# Patient Record
Sex: Female | Born: 1970 | Race: White | Hispanic: No | Marital: Single | Smoking: Never smoker
Health system: Southern US, Community
[De-identification: ages and names within clinical notes are randomized; demographics above are authoritative.]

## PROBLEM LIST (undated history)

## (undated) DIAGNOSIS — J45909 Unspecified asthma, uncomplicated: Secondary | ICD-10-CM

## (undated) DIAGNOSIS — F32A Depression, unspecified: Secondary | ICD-10-CM

## (undated) DIAGNOSIS — F329 Major depressive disorder, single episode, unspecified: Secondary | ICD-10-CM

## (undated) DIAGNOSIS — K5900 Constipation, unspecified: Secondary | ICD-10-CM

## (undated) DIAGNOSIS — G4700 Insomnia, unspecified: Secondary | ICD-10-CM

## (undated) DIAGNOSIS — I10 Essential (primary) hypertension: Secondary | ICD-10-CM

## (undated) HISTORY — DX: Essential (primary) hypertension: I10

## (undated) HISTORY — DX: Constipation, unspecified: K59.00

## (undated) HISTORY — DX: Unspecified asthma, uncomplicated: J45.909

## (undated) HISTORY — DX: Major depressive disorder, single episode, unspecified: F32.9

## (undated) HISTORY — DX: Depression, unspecified: F32.A

## (undated) HISTORY — DX: Insomnia, unspecified: G47.00

---

## 2013-08-15 ENCOUNTER — Ambulatory Visit: Payer: Self-pay

## 2015-05-12 ENCOUNTER — Other Ambulatory Visit: Payer: Self-pay

## 2015-05-12 NOTE — Telephone Encounter (Signed)
Called and left patient a voicemail to return my call and schedule an appointment. 

## 2015-05-12 NOTE — Telephone Encounter (Signed)
I do not recommend she take birth control pills, looking at her last blood pressure (note was from October) Please suggest to the patient that she use some other form of contraception (condoms, for example) and make an appointment to see her primary provider for blood pressure and to discuss contraception issues

## 2015-05-13 NOTE — Telephone Encounter (Signed)
Called and left patient a voicemail to return my call and schedule an appointment.

## 2015-05-14 NOTE — Telephone Encounter (Signed)
Tried to call patient but there was no answer so I left a voicemail for her to return my call and schedule an appointment.

## 2015-06-03 DIAGNOSIS — F32A Depression, unspecified: Secondary | ICD-10-CM

## 2015-06-03 DIAGNOSIS — F419 Anxiety disorder, unspecified: Secondary | ICD-10-CM

## 2015-06-03 DIAGNOSIS — G4700 Insomnia, unspecified: Secondary | ICD-10-CM

## 2015-06-03 DIAGNOSIS — E669 Obesity, unspecified: Secondary | ICD-10-CM | POA: Insufficient documentation

## 2015-06-03 DIAGNOSIS — E039 Hypothyroidism, unspecified: Secondary | ICD-10-CM

## 2015-06-03 DIAGNOSIS — I10 Essential (primary) hypertension: Secondary | ICD-10-CM

## 2015-06-03 DIAGNOSIS — F411 Generalized anxiety disorder: Secondary | ICD-10-CM | POA: Insufficient documentation

## 2015-06-03 DIAGNOSIS — F329 Major depressive disorder, single episode, unspecified: Secondary | ICD-10-CM

## 2015-06-03 DIAGNOSIS — Z713 Dietary counseling and surveillance: Secondary | ICD-10-CM | POA: Insufficient documentation

## 2015-06-03 DIAGNOSIS — E063 Autoimmune thyroiditis: Secondary | ICD-10-CM | POA: Insufficient documentation

## 2015-06-03 DIAGNOSIS — J45909 Unspecified asthma, uncomplicated: Secondary | ICD-10-CM

## 2015-06-07 ENCOUNTER — Encounter: Payer: Self-pay | Admitting: Unknown Physician Specialty

## 2015-06-07 ENCOUNTER — Ambulatory Visit (INDEPENDENT_AMBULATORY_CARE_PROVIDER_SITE_OTHER): Payer: PRIVATE HEALTH INSURANCE | Admitting: Unknown Physician Specialty

## 2015-06-07 VITALS — BP 145/89 | HR 102 | Temp 98.4°F | Ht 64.5 in | Wt 176.8 lb

## 2015-06-07 DIAGNOSIS — G4700 Insomnia, unspecified: Secondary | ICD-10-CM

## 2015-06-07 DIAGNOSIS — I10 Essential (primary) hypertension: Secondary | ICD-10-CM

## 2015-06-07 DIAGNOSIS — F329 Major depressive disorder, single episode, unspecified: Secondary | ICD-10-CM

## 2015-06-07 DIAGNOSIS — G43909 Migraine, unspecified, not intractable, without status migrainosus: Secondary | ICD-10-CM

## 2015-06-07 DIAGNOSIS — F32A Depression, unspecified: Secondary | ICD-10-CM

## 2015-06-07 MED ORDER — DESOGESTREL-ETHINYL ESTRADIOL 0.15-30 MG-MCG PO TABS: 1.0000 | ORAL_TABLET | Freq: Every day | ORAL | Status: DC

## 2015-06-07 MED ORDER — SUMATRIPTAN 20 MG/ACT NA SOLN: 20.0000 mg | Freq: Two times a day (BID) | NASAL | Status: DC

## 2015-06-07 NOTE — Progress Notes (Signed)
BP 145/89 mmHg  Pulse 102  Temp(Src) 98.4 F (36.9 C)  Ht 5' 4.5" (1.638 m)  Wt 176 lb 12.8 oz (80.196 kg)  BMI 29.89 kg/m2  SpO2 98%  LMP  (LMP Unknown)   Subjective:    Patient ID: Laurie Martinez, female    DOB: 09/24/1971, 44 y.o.   MRN: [id number]  HPI: Laurie Martinez is a 44 y.o. female  Chief Complaint  Patient presents with  . Depression  . Hypertension  . Medication Refill    pt states she needs imitrex refilled and needs it to go to [facility] in [place] because normal pharmacy cannot give it to her   Depresssion: Pt states she is doing better than she has in a long time.  She is losing weight on a modified Paleo diet.  She is also trying to get rid of things.  PHQ 2 is 2.    Hypertension This is a chronic problem. The problem is controlled (High today but very nervous.  BP outside the office is 120-s to 130's over 80's). Associated symptoms include anxiety. Pertinent negatives include no chest pain, neck pain or shortness of breath. There are no known risk factors for coronary artery disease. The current treatment provides moderate improvement. There are no compliance problems.    Migraines Needs a refill of meds.  Gets migraines once or twice a month   Constipation Controlled with Miralax that she takes daily.     Relevant past medical, surgical, family and social history reviewed and updated as indicated. Interim medical history since our last visit reviewed. Allergies and medications reviewed and updated.  Review of Systems  Respiratory: Negative for shortness of breath.   Cardiovascular: Negative for chest pain.  Musculoskeletal: Negative for neck pain.    Per HPI unless specifically indicated above     Objective:    BP 145/89 mmHg  Pulse 102  Temp(Src) 98.4 F (36.9 C)  Ht 5' 4.5" (1.638 m)  Wt 176 lb 12.8 oz (80.196 kg)  BMI 29.89 kg/m2  SpO2 98%  LMP  (LMP Unknown)  Wt Readings from Last 3 Encounters:  06/07/15 176 lb 12.8 oz (80.196 kg)   06/03/15 181 lb (82.101 kg)    Physical Exam  Constitutional: She is oriented to person, place, and time. She appears well-developed and well-nourished. No distress.  HENT:  Head: Normocephalic and atraumatic.  Eyes: Conjunctivae and lids are normal. Right eye exhibits no discharge. Left eye exhibits no discharge. No scleral icterus.  Cardiovascular: Normal rate, regular rhythm and normal heart sounds.   Pulmonary/Chest: Effort normal and breath sounds normal. No respiratory distress.  Abdominal: Normal appearance and bowel sounds are normal. She exhibits no distension. There is no splenomegaly or hepatomegaly. There is no tenderness.  Musculoskeletal: Normal range of motion.  Neurological: She is alert and oriented to person, place, and time.  Skin: Skin is intact. No rash noted. No pallor.  Psychiatric: She has a normal mood and affect. Her behavior is normal. Judgment and thought content normal.  Vitals reviewed.   No results found for this or any previous visit.    Assessment & Plan:   Problem List Items Addressed This Visit      Unprioritized   Insomnia    Using 8-10 Ambien per year      Depression    Stable.  Continue present meds.  Takes 1-2 Clonazepam/month      Hypertension    Stable outside the office.  Continue  present meds      Migraines - Primary    Continue current meds.  Stable      Relevant Medications   SUMAtriptan (IMITREX) 20 MG/ACT nasal spray       Follow up plan: Return in about 3 months (around 09/07/2015) for physical.

## 2015-06-07 NOTE — Assessment & Plan Note (Signed)
Continue current meds. Stable.  

## 2015-06-07 NOTE — Assessment & Plan Note (Signed)
Using 8-10 Ambien per year

## 2015-06-07 NOTE — Assessment & Plan Note (Signed)
Stable outside the office.  Continue present meds

## 2015-06-07 NOTE — Assessment & Plan Note (Addendum)
Stable.  Continue present meds.  Takes 1-2 Clonazepam/month

## 2015-07-21 ENCOUNTER — Telehealth: Payer: Self-pay | Admitting: Unknown Physician Specialty

## 2015-07-21 MED ORDER — ALBUTEROL SULFATE HFA 108 (90 BASE) MCG/ACT IN AERS: 2.0000 | INHALATION_SPRAY | RESPIRATORY_TRACT | Status: DC | PRN

## 2015-07-21 NOTE — Telephone Encounter (Signed)
[facility] called stated pt needs an RX for an inhaler (Ventolin), and update all regular maintenance meds. Thanks.

## 2015-07-21 NOTE — Telephone Encounter (Signed)
Routing to provider  

## 2015-08-20 ENCOUNTER — Other Ambulatory Visit: Payer: Self-pay

## 2015-08-20 MED ORDER — MONTELUKAST SODIUM 10 MG PO TABS: 10.0000 mg | ORAL_TABLET | Freq: Every day | ORAL | Status: DC

## 2015-08-20 NOTE — Telephone Encounter (Signed)
PATIENT: Laurie Martinez DOB: 11-24-1970 PHARMACY: [facility] LAST VISIT: 06/07/2015  Patient requests Singulair 10mg  tab # 30.

## 2015-09-07 ENCOUNTER — Ambulatory Visit (INDEPENDENT_AMBULATORY_CARE_PROVIDER_SITE_OTHER): Payer: PRIVATE HEALTH INSURANCE | Admitting: Unknown Physician Specialty

## 2015-09-07 ENCOUNTER — Encounter: Payer: Self-pay | Admitting: Unknown Physician Specialty

## 2015-09-07 VITALS — BP 135/87 | HR 92 | Temp 98.7°F | Ht 63.5 in | Wt 174.2 lb

## 2015-09-07 DIAGNOSIS — E039 Hypothyroidism, unspecified: Secondary | ICD-10-CM

## 2015-09-07 DIAGNOSIS — I10 Essential (primary) hypertension: Secondary | ICD-10-CM | POA: Diagnosis not present

## 2015-09-07 DIAGNOSIS — Z23 Encounter for immunization: Secondary | ICD-10-CM | POA: Diagnosis not present

## 2015-09-07 DIAGNOSIS — J4531 Mild persistent asthma with (acute) exacerbation: Secondary | ICD-10-CM | POA: Diagnosis not present

## 2015-09-07 DIAGNOSIS — J40 Bronchitis, not specified as acute or chronic: Secondary | ICD-10-CM | POA: Diagnosis not present

## 2015-09-07 DIAGNOSIS — G4700 Insomnia, unspecified: Secondary | ICD-10-CM

## 2015-09-07 DIAGNOSIS — Z0000 Encounter for general adult medical examination without abnormal findings: Secondary | ICD-10-CM | POA: Diagnosis not present

## 2015-09-07 LAB — MICROALBUMIN, URINE WAIVED
CREATININE, URINE WAIVED: 200 mg/dL (ref 10–300)
MICROALB, UR WAIVED: 30 mg/L — AB (ref 0–19)

## 2015-09-07 MED ORDER — FLUTICASONE-SALMETEROL 250-50 MCG/DOSE IN AEPB: 1.0000 | INHALATION_SPRAY | Freq: Two times a day (BID) | RESPIRATORY_TRACT | Status: DC

## 2015-09-07 MED ORDER — LEVOTHYROXINE SODIUM 50 MCG PO TABS: 50.0000 ug | ORAL_TABLET | Freq: Every day | ORAL | Status: DC

## 2015-09-07 MED ORDER — AZITHROMYCIN 250 MG PO TABS: ORAL_TABLET | ORAL | Status: DC

## 2015-09-07 NOTE — Assessment & Plan Note (Addendum)
High today.  Stop OTC sinus medication.  On recheck SBP was 135

## 2015-09-07 NOTE — Progress Notes (Signed)
BP 135/87 mmHg  Pulse 92  Temp(Src) 98.7 F (37.1 C)  Ht 5' 3.5" (1.613 m)  Wt 174 lb 3.2 oz (79.017 kg)  BMI 30.37 kg/m2  SpO2 97%  LMP 06/15/2015 (Approximate)   Subjective:    Patient ID: Laurie Martinez, female    DOB: 05/06/1971, 44 y.o.   MRN: [id number]  HPI: Laurie Martinez is a 44 y.o. female  Chief Complaint  Patient presents with  . Annual Exam    pt states she wants to talk to provider about flu shot before getting it   Depression screen [facility] 2/9 09/07/2015  Decreased Interest 2  Down, Depressed, Hopeless 1  PHQ - 2 Score 3  Altered sleeping 3  Tired, decreased energy 1  Change in appetite 0  Feeling bad or failure about yourself  0  Trouble concentrating 0  Moving slowly or fidgety/restless 0  Suicidal thoughts 0  PHQ-9 Score 7   Cough She has been sick for about 6 weeks.  SOB with activity.  She does wheeze with activity.  She is taking OTC cold and sinus meds.  ? Reason for elevated blood sugar.  No fever, no nasal congestion after the first week or 2.    Hypothyroid Weight is stable.  Energy level is good when she is not sick   Insomnia/Anxiety Takes Clonazepam or Ambien a couple of times a month.  Still doesn't sleep well.    Relevant past medical, surgical, family and social history reviewed and updated as indicated. Interim medical history since our last visit reviewed. Allergies and medications reviewed and updated.  Review of Systems  Constitutional: Negative.   HENT: Negative.   Eyes: Negative.   Cardiovascular: Negative.   Gastrointestinal: Negative.   Endocrine: Negative.   Genitourinary: Negative.   Musculoskeletal: Negative.   Skin: Negative.   Allergic/Immunologic: Negative.   Neurological: Negative.   Hematological: Negative.   Psychiatric/Behavioral: Positive for sleep disturbance.    Per HPI unless specifically indicated above     Objective:    BP 135/87 mmHg  Pulse 92  Temp(Src) 98.7 F (37.1 C)  Ht 5' 3.5" (1.613 m)   Wt 174 lb 3.2 oz (79.017 kg)  BMI 30.37 kg/m2  SpO2 97%  LMP 06/15/2015 (Approximate)  Wt Readings from Last 3 Encounters:  09/07/15 174 lb 3.2 oz (79.017 kg)  06/07/15 176 lb 12.8 oz (80.196 kg)  06/03/15 181 lb (82.101 kg)    Physical Exam  Constitutional: She is oriented to person, place, and time. She appears well-developed and well-nourished.  HENT:  Head: Normocephalic and atraumatic.  Eyes: Pupils are equal, round, and reactive to light. Right eye exhibits no discharge. Left eye exhibits no discharge. No scleral icterus.  Neck: Normal range of motion. Neck supple. Carotid bruit is not present. No thyromegaly present.  Cardiovascular: Normal rate, regular rhythm and normal heart sounds.  Exam reveals no gallop and no friction rub.   No murmur heard. Pulmonary/Chest: Effort normal and breath sounds normal. No respiratory distress. She has no wheezes. She has no rales.  Abdominal: Soft. Bowel sounds are normal. There is no tenderness. There is no rebound.  Genitourinary: No breast swelling, tenderness or discharge.  Musculoskeletal: Normal range of motion.  Lymphadenopathy:    She has no cervical adenopathy.  Neurological: She is alert and oriented to person, place, and time.  Skin: Skin is warm, dry and intact. No rash noted.  Psychiatric: She has a normal mood and affect. Her speech is  normal and behavior is normal. Judgment and thought content normal. Cognition and memory are normal.    No results found for this or any previous visit.    Assessment & Plan:   Problem List Items Addressed This Visit      Unprioritized   Insomnia    Discussed CBT for sleep and exercise      Hypothyroidism    Check TSH      Relevant Medications   levothyroxine (SYNTHROID, LEVOTHROID) 50 MCG tablet   Other Relevant Orders   TSH   Hypertension - Primary    High today.  Stop OTC sinus medication.  On recheck SBP was 135      Relevant Orders   Comprehensive metabolic panel    Lipid Panel w/o Chol/HDL Ratio   Uric acid   Microalbumin, Urine Waived    Other Visit Diagnoses    Routine general medical examination at a health care facility        Relevant Orders    CBC with Differential/Platelet    Comprehensive metabolic panel    Lipid Panel w/o Chol/HDL Ratio    TSH    HIV antibody    Vit D  25 hydroxy (rtn osteoporosis monitoring)    Tdap vaccine greater than or equal to 7yo IM (Completed)    Bronchitis        Rx for Advair with asthma flare.  Rx for Z pack    Asthma with acute exacerbation, mild persistent        Add Advair for one month with recent viral flare    Relevant Medications    Fluticasone-Salmeterol (ADVAIR DISKUS) 250-50 MCG/DOSE AEPB        Follow up plan: Return if symptoms worsen or fail to improve.

## 2015-09-07 NOTE — Assessment & Plan Note (Signed)
Check TSH 

## 2015-09-07 NOTE — Assessment & Plan Note (Signed)
Discussed CBT for sleep and exercise

## 2015-09-07 NOTE — Patient Instructions (Signed)
Studies have shown that cognitive behavioral therapies for sleep are more effective than medications.  There are some less expensive on-line programs for this that are a self-paced 6 week program.  Go to shuti.com(used by sleep labs) or https://www.haley-woods.biz/cbtforsleep.com.  There are others that are probably just as effective.

## 2015-09-08 ENCOUNTER — Encounter: Payer: Self-pay | Admitting: Unknown Physician Specialty

## 2015-09-08 LAB — COMPREHENSIVE METABOLIC PANEL
ALK PHOS: 69 IU/L (ref 39–117)
ALT: 8 IU/L (ref 0–32)
AST: 13 IU/L (ref 0–40)
Albumin/Globulin Ratio: 1.3 (ref 1.1–2.5)
Albumin: 4 g/dL (ref 3.5–5.5)
BILIRUBIN TOTAL: 0.2 mg/dL (ref 0.0–1.2)
BUN/Creatinine Ratio: 14 (ref 9–23)
BUN: 11 mg/dL (ref 6–24)
CHLORIDE: 100 mmol/L (ref 97–106)
CO2: 23 mmol/L (ref 18–29)
Calcium: 9 mg/dL (ref 8.7–10.2)
Creatinine, Ser: 0.78 mg/dL (ref 0.57–1.00)
GFR calc Af Amer: 108 mL/min/{1.73_m2} (ref >59)
GFR calc non Af Amer: 93 mL/min/{1.73_m2} (ref >59)
GLUCOSE: 89 mg/dL (ref 65–99)
Globulin, Total: 3 g/dL (ref 1.5–4.5)
Potassium: 4.8 mmol/L (ref 3.5–5.2)
Sodium: 138 mmol/L (ref 136–144)
Total Protein: 7 g/dL (ref 6.0–8.5)

## 2015-09-08 LAB — CBC WITH DIFFERENTIAL/PLATELET
BASOS ABS: 0 10*3/uL (ref 0.0–0.2)
Basos: 0 %
EOS (ABSOLUTE): 0.1 10*3/uL (ref 0.0–0.4)
Eos: 1 %
Hematocrit: 40.5 % (ref 34.0–46.6)
Hemoglobin: 13.7 g/dL (ref 11.1–15.9)
Immature Grans (Abs): 0 10*3/uL (ref 0.0–0.1)
Immature Granulocytes: 0 %
LYMPHS ABS: 2.3 10*3/uL (ref 0.7–3.1)
LYMPHS: 27 %
MCH: 32.3 pg (ref 26.6–33.0)
MCHC: 33.8 g/dL (ref 31.5–35.7)
MCV: 96 fL (ref 79–97)
Monocytes Absolute: 0.4 10*3/uL (ref 0.1–0.9)
Monocytes: 5 %
NEUTROS ABS: 5.6 10*3/uL (ref 1.4–7.0)
Neutrophils: 67 %
PLATELETS: 254 10*3/uL (ref 150–379)
RBC: 4.24 x10E6/uL (ref 3.77–5.28)
RDW: 12.7 % (ref 12.3–15.4)
WBC: 8.4 10*3/uL (ref 3.4–10.8)

## 2015-09-08 LAB — TSH: TSH: 1.69 u[IU]/mL (ref 0.450–4.500)

## 2015-09-08 LAB — VITAMIN D 25 HYDROXY (VIT D DEFICIENCY, FRACTURES): VIT D 25 HYDROXY: 9.5 ng/mL — AB (ref 30.0–100.0)

## 2015-09-08 LAB — LIPID PANEL W/O CHOL/HDL RATIO
CHOLESTEROL TOTAL: 195 mg/dL (ref 100–199)
HDL: 61 mg/dL (ref >39)
LDL Calculated: 104 mg/dL — ABNORMAL HIGH (ref 0–99)
Triglycerides: 148 mg/dL (ref 0–149)
VLDL CHOLESTEROL CAL: 30 mg/dL (ref 5–40)

## 2015-09-08 LAB — HIV ANTIBODY (ROUTINE TESTING W REFLEX): HIV Screen 4th Generation wRfx: NONREACTIVE

## 2015-09-08 LAB — URIC ACID: Uric Acid: 5.1 mg/dL (ref 2.5–7.1)

## 2015-09-08 NOTE — Progress Notes (Signed)
Quick Note:  Normal labs except low Vitamin D. Patient notified by letter. ______

## 2015-10-28 ENCOUNTER — Other Ambulatory Visit: Payer: Self-pay

## 2015-10-28 MED ORDER — FLUTICASONE-SALMETEROL 250-50 MCG/DOSE IN AEPB: 1.0000 | INHALATION_SPRAY | Freq: Two times a day (BID) | RESPIRATORY_TRACT | Status: DC

## 2015-10-28 NOTE — Telephone Encounter (Signed)
Patient was last seen 09/07/15 and pharmacy is [facility].

## 2015-11-12 ENCOUNTER — Other Ambulatory Visit: Payer: Self-pay

## 2015-11-12 MED ORDER — DESOGESTREL-ETHINYL ESTRADIOL 0.15-30 MG-MCG PO TABS: 1.0000 | ORAL_TABLET | Freq: Every day | ORAL | Status: DC

## 2015-11-12 NOTE — Telephone Encounter (Signed)
Patient was last seen 09/07/15 and pharmacy is [facility]. 

## 2016-01-04 ENCOUNTER — Other Ambulatory Visit: Payer: Self-pay

## 2016-01-04 MED ORDER — CLONAZEPAM 0.5 MG PO TABS: 0.5000 mg | ORAL_TABLET | Freq: Every day | ORAL | Status: DC | PRN

## 2016-01-04 MED ORDER — ZOLPIDEM TARTRATE 10 MG PO TABS: 10.0000 mg | ORAL_TABLET | Freq: Every evening | ORAL | Status: DC | PRN

## 2016-01-04 NOTE — Telephone Encounter (Signed)
Called and left patient a voicemail letting her know that her prescriptions were ready for her to pick up and take to her pharmacy to be filled.

## 2016-01-04 NOTE — Telephone Encounter (Signed)
Patient was last seen 09/07/15 for physical.

## 2016-05-08 ENCOUNTER — Other Ambulatory Visit: Payer: Self-pay | Admitting: Unknown Physician Specialty

## 2016-05-29 ENCOUNTER — Other Ambulatory Visit: Payer: Self-pay | Admitting: Unknown Physician Specialty

## 2016-05-29 NOTE — Telephone Encounter (Signed)
Your patient 

## 2016-09-15 ENCOUNTER — Ambulatory Visit (INDEPENDENT_AMBULATORY_CARE_PROVIDER_SITE_OTHER): Payer: Managed Care, Other (non HMO) | Admitting: Unknown Physician Specialty

## 2016-09-15 ENCOUNTER — Encounter: Payer: Self-pay | Admitting: Unknown Physician Specialty

## 2016-09-15 VITALS — BP 145/88 | HR 98 | Temp 97.8°F | Ht 64.5 in | Wt 186.0 lb

## 2016-09-15 DIAGNOSIS — J45909 Unspecified asthma, uncomplicated: Secondary | ICD-10-CM

## 2016-09-15 DIAGNOSIS — G43909 Migraine, unspecified, not intractable, without status migrainosus: Secondary | ICD-10-CM

## 2016-09-15 DIAGNOSIS — E039 Hypothyroidism, unspecified: Secondary | ICD-10-CM

## 2016-09-15 DIAGNOSIS — F329 Major depressive disorder, single episode, unspecified: Secondary | ICD-10-CM | POA: Diagnosis not present

## 2016-09-15 DIAGNOSIS — F5101 Primary insomnia: Secondary | ICD-10-CM

## 2016-09-15 DIAGNOSIS — F32A Depression, unspecified: Secondary | ICD-10-CM

## 2016-09-15 DIAGNOSIS — I10 Essential (primary) hypertension: Secondary | ICD-10-CM

## 2016-09-15 MED ORDER — DESOGESTREL-ETHINYL ESTRADIOL 0.15-30 MG-MCG PO TABS: 1.0000 | ORAL_TABLET | Freq: Every day | ORAL | 0 refills | Status: DC

## 2016-09-15 NOTE — Assessment & Plan Note (Addendum)
Uses Albuterol occasionally.  Using Advair during the difficult periods of the year.  Continue present

## 2016-09-15 NOTE — Progress Notes (Signed)
BP (!) 145/88 (BP Location: Left Arm, Cuff Size: Large)   Pulse 98   Temp 97.8 F (36.6 C)   Ht 5' 4.5" (1.638 m)   Wt 186 lb (84.4 kg)   LMP 09/12/2016 (Approximate)   SpO2 97%   BMI 31.43 kg/m    Subjective:    Patient ID: Laurie Martinez, female    DOB: 1971-02-18, 45 y.o.   MRN: [id number]  HPI: Laurie Martinez is a 45 y.o. female   Family History  Problem Relation Age of Onset  . Depression Mother   . Migraines Sister    Social History   Social History  . Marital status: Single    Spouse name: N/A  . Number of children: N/A  . Years of education: N/A   Occupational History  . Not on file.   Social History Main Topics  . Smoking status: Never Smoker  . Smokeless tobacco: Never Used  . Alcohol use No  . Drug use: No  . Sexual activity: No   Other Topics Concern  . Not on file   Social History Narrative  . No narrative on file   Past Medical History:  Diagnosis Date  . Asthma   . Constipation   . Depression   . Hypertension   . Insomnia    Social History   Social History  . Marital status: Single    Spouse name: N/A  . Number of children: N/A  . Years of education: N/A   Social History Main Topics  . Smoking status: Never Smoker  . Smokeless tobacco: Never Used  . Alcohol use No  . Drug use: No  . Sexual activity: No   Other Topics Concern  . None   Social History Narrative  . None   Hypertension Using medications without difficulty Average home BPs "a little higher"   No problems or lightheadedness No chest pain with exertion or shortness of breath No Edema  Hypothyroid Some weight gain since last visit  Depression I doing well and having more anxiety.  Takes occasional Clonazepam Depression screen [facility] 2/9 09/15/2016 09/07/2015  Decreased Interest 0 2  Down, Depressed, Hopeless 0 1  PHQ - 2 Score 0 3  Altered sleeping 3 3  Tired, decreased energy 1 1  Change in appetite 1 0  Feeling bad or failure about yourself  0 0    Trouble concentrating 1 0  Moving slowly or fidgety/restless 0 0  Suicidal thoughts 0 0  PHQ-9 Score 6 7    Insomnia Takes Ambien once a month  ASTHMA  Uses Albuterol occasionally.  Using Advair during the difficult periods of the year. Triggers: none Symptoms are well controlled:yes Using medications without problems: Night time symptoms:none Wheeze/SOB:none ER visits since last visit: none Missed work or school:none    Relevant past medical, surgical, family and social history reviewed and updated as indicated. Interim medical history since our last visit reviewed. Allergies and medications reviewed and updated.  Review of Systems  Per HPI unless specifically indicated above     Objective:    BP (!) 145/88 (BP Location: Left Arm, Cuff Size: Large)   Pulse 98   Temp 97.8 F (36.6 C)   Ht 5' 4.5" (1.638 m)   Wt 186 lb (84.4 kg)   LMP 09/12/2016 (Approximate)   SpO2 97%   BMI 31.43 kg/m   Wt Readings from Last 3 Encounters:  09/15/16 186 lb (84.4 kg)  09/07/15 174 lb 3.2 oz (79  kg)  06/07/15 176 lb 12.8 oz (80.2 kg)    Physical Exam  Constitutional: She is oriented to person, place, and time. She appears well-developed and well-nourished. No distress.  HENT:  Head: Normocephalic and atraumatic.  Eyes: Conjunctivae and lids are normal. Right eye exhibits no discharge. Left eye exhibits no discharge. No scleral icterus.  Neck: Normal range of motion. Neck supple. No JVD present. Carotid bruit is not present.  Cardiovascular: Normal rate, regular rhythm and normal heart sounds.   Pulmonary/Chest: Effort normal and breath sounds normal.  Abdominal: Normal appearance. There is no splenomegaly or hepatomegaly.  Musculoskeletal: Normal range of motion.  Neurological: She is alert and oriented to person, place, and time.  Skin: Skin is warm, dry and intact. No rash noted. No pallor.  Psychiatric: She has a normal mood and affect. Her behavior is normal. Judgment and  thought content normal.    Results for orders placed or performed in visit on 09/07/15  CBC with Differential/Platelet  Result Value Ref Range   WBC 8.4 3.4 - 10.8 x10E3/uL   RBC 4.24 3.77 - 5.28 x10E6/uL   Hemoglobin 13.7 11.1 - 15.9 g/dL   Hematocrit 16.140.5 09.634.0 - 46.6 %   MCV 96 79 - 97 fL   MCH 32.3 26.6 - 33.0 pg   MCHC 33.8 31.5 - 35.7 g/dL   RDW 04.512.7 40.912.3 - 81.115.4 %   Platelets 254 150 - 379 x10E3/uL   Neutrophils 67 %   Lymphs 27 %   Monocytes 5 %   Eos 1 %   Basos 0 %   Neutrophils Absolute 5.6 1.4 - 7.0 x10E3/uL   Lymphocytes Absolute 2.3 0.7 - 3.1 x10E3/uL   Monocytes Absolute 0.4 0.1 - 0.9 x10E3/uL   EOS (ABSOLUTE) 0.1 0.0 - 0.4 x10E3/uL   Basophils Absolute 0.0 0.0 - 0.2 x10E3/uL   Immature Granulocytes 0 %   Immature Grans (Abs) 0.0 0.0 - 0.1 x10E3/uL  Comprehensive metabolic panel  Result Value Ref Range   Glucose 89 65 - 99 mg/dL   BUN 11 6 - 24 mg/dL   Creatinine, Ser 9.140.78 0.57 - 1.00 mg/dL   GFR calc non Af Amer 93 >59 mL/min/1.73   GFR calc Af Amer 108 >59 mL/min/1.73   BUN/Creatinine Ratio 14 9 - 23   Sodium 138 136 - 144 mmol/L   Potassium 4.8 3.5 - 5.2 mmol/L   Chloride 100 97 - 106 mmol/L   CO2 23 18 - 29 mmol/L   Calcium 9.0 8.7 - 10.2 mg/dL   Total Protein 7.0 6.0 - 8.5 g/dL   Albumin 4.0 3.5 - 5.5 g/dL   Globulin, Total 3.0 1.5 - 4.5 g/dL   Albumin/Globulin Ratio 1.3 1.1 - 2.5   Bilirubin Total 0.2 0.0 - 1.2 mg/dL   Alkaline Phosphatase 69 39 - 117 IU/L   AST 13 0 - 40 IU/L   ALT 8 0 - 32 IU/L  Lipid Panel w/o Chol/HDL Ratio  Result Value Ref Range   Cholesterol, Total 195 100 - 199 mg/dL   Triglycerides 782148 0 - 149 mg/dL   HDL 61 >95>39 mg/dL   VLDL Cholesterol Cal 30 5 - 40 mg/dL   LDL Calculated 621104 (H) 0 - 99 mg/dL  TSH  Result Value Ref Range   TSH 1.690 0.450 - 4.500 uIU/mL  HIV antibody  Result Value Ref Range   HIV Screen 4th Generation wRfx Non Reactive Non Reactive  Vit D  25 hydroxy (rtn osteoporosis monitoring)  Result Value  Ref Range   Vit D, 25-Hydroxy 9.5 (L) 30.0 - 100.0 ng/mL  Uric acid  Result Value Ref Range   Uric Acid 5.1 2.5 - 7.1 mg/dL  Microalbumin, Urine Waived  Result Value Ref Range   Microalb, Ur Waived 30 (H) 0 - 19 mg/L   Creatinine, Urine Waived 200 10 - 300 mg/dL   Microalb/Creat Ratio <30 <30 mg/g      Assessment & Plan:   Problem List Items Addressed This Visit      Unprioritized   Asthma    Uses Albuterol occasionally.  Using Advair during the difficult periods of the year.  Continue present      Depression    Stable, continue present medications.        Relevant Orders   CBC with Differential/Platelet   Hypertension    A little high today      Relevant Orders   Comprehensive metabolic panel   Lipid Panel w/o Chol/HDL Ratio   TSH   Hypothyroidism - Primary   Relevant Orders   TSH   Insomnia    Occasional Ambien use      Migraines    With menses.  Stable          Follow up plan: Return for physical.

## 2016-09-15 NOTE — Patient Instructions (Addendum)
For anxiety Valerian root  L theanine  Chamomile - Not in tea  [place]hatanga  Magnesium glycinate or Citrate  Calcium

## 2016-09-15 NOTE — Assessment & Plan Note (Signed)
With menses.  Stable

## 2016-09-15 NOTE — Assessment & Plan Note (Signed)
A little high today

## 2016-09-15 NOTE — Assessment & Plan Note (Signed)
Stable, continue present medications.   

## 2016-09-15 NOTE — Assessment & Plan Note (Signed)
Occasional Ambien use

## 2016-09-16 LAB — LIPID PANEL W/O CHOL/HDL RATIO
Cholesterol, Total: 232 mg/dL — ABNORMAL HIGH (ref 100–199)
HDL: 59 mg/dL (ref >39)
LDL Calculated: 155 mg/dL — ABNORMAL HIGH (ref 0–99)
Triglycerides: 90 mg/dL (ref 0–149)
VLDL Cholesterol Cal: 18 mg/dL (ref 5–40)

## 2016-09-16 LAB — COMPREHENSIVE METABOLIC PANEL
ALBUMIN: 4 g/dL (ref 3.5–5.5)
ALT: 14 IU/L (ref 0–32)
AST: 17 IU/L (ref 0–40)
Albumin/Globulin Ratio: 1.3 (ref 1.2–2.2)
Alkaline Phosphatase: 76 IU/L (ref 39–117)
BUN / CREAT RATIO: 10 (ref 9–23)
BUN: 9 mg/dL (ref 6–24)
CO2: 20 mmol/L (ref 18–29)
CREATININE: 0.89 mg/dL (ref 0.57–1.00)
Calcium: 9.5 mg/dL (ref 8.7–10.2)
Chloride: 101 mmol/L (ref 96–106)
GFR, EST AFRICAN AMERICAN: 91 mL/min/{1.73_m2} (ref >59)
GFR, EST NON AFRICAN AMERICAN: 79 mL/min/{1.73_m2} (ref >59)
GLUCOSE: 103 mg/dL — AB (ref 65–99)
Globulin, Total: 3.2 g/dL (ref 1.5–4.5)
Potassium: 4.6 mmol/L (ref 3.5–5.2)
Sodium: 140 mmol/L (ref 134–144)
TOTAL PROTEIN: 7.2 g/dL (ref 6.0–8.5)

## 2016-09-16 LAB — CBC WITH DIFFERENTIAL/PLATELET
BASOS ABS: 0 10*3/uL (ref 0.0–0.2)
Basos: 1 %
EOS (ABSOLUTE): 0.2 10*3/uL (ref 0.0–0.4)
EOS: 3 %
HEMATOCRIT: 40.6 % (ref 34.0–46.6)
HEMOGLOBIN: 14 g/dL (ref 11.1–15.9)
IMMATURE GRANULOCYTES: 0 %
Immature Grans (Abs): 0 10*3/uL (ref 0.0–0.1)
LYMPHS: 35 %
Lymphocytes Absolute: 2.2 10*3/uL (ref 0.7–3.1)
MCH: 32.1 pg (ref 26.6–33.0)
MCHC: 34.5 g/dL (ref 31.5–35.7)
MCV: 93 fL (ref 79–97)
MONOCYTES: 7 %
Monocytes Absolute: 0.5 10*3/uL (ref 0.1–0.9)
NEUTROS PCT: 54 %
Neutrophils Absolute: 3.5 10*3/uL (ref 1.4–7.0)
Platelets: 274 10*3/uL (ref 150–379)
RBC: 4.36 x10E6/uL (ref 3.77–5.28)
RDW: 12.3 % (ref 12.3–15.4)
WBC: 6.5 10*3/uL (ref 3.4–10.8)

## 2016-09-16 LAB — TSH: TSH: 0.807 u[IU]/mL (ref 0.450–4.500)

## 2016-09-18 ENCOUNTER — Encounter: Payer: Self-pay | Admitting: Unknown Physician Specialty

## 2016-09-25 ENCOUNTER — Other Ambulatory Visit: Payer: Self-pay | Admitting: Unknown Physician Specialty

## 2016-12-13 ENCOUNTER — Ambulatory Visit (INDEPENDENT_AMBULATORY_CARE_PROVIDER_SITE_OTHER): Payer: Managed Care, Other (non HMO) | Admitting: Unknown Physician Specialty

## 2016-12-13 ENCOUNTER — Encounter: Payer: Self-pay | Admitting: Unknown Physician Specialty

## 2016-12-13 VITALS — BP 145/89 | HR 101 | Temp 98.8°F | Ht 63.3 in | Wt 180.8 lb

## 2016-12-13 DIAGNOSIS — E039 Hypothyroidism, unspecified: Secondary | ICD-10-CM

## 2016-12-13 DIAGNOSIS — F5101 Primary insomnia: Secondary | ICD-10-CM

## 2016-12-13 DIAGNOSIS — F329 Major depressive disorder, single episode, unspecified: Secondary | ICD-10-CM | POA: Diagnosis not present

## 2016-12-13 DIAGNOSIS — I10 Essential (primary) hypertension: Secondary | ICD-10-CM

## 2016-12-13 DIAGNOSIS — Z0000 Encounter for general adult medical examination without abnormal findings: Secondary | ICD-10-CM | POA: Diagnosis not present

## 2016-12-13 DIAGNOSIS — F32A Depression, unspecified: Secondary | ICD-10-CM

## 2016-12-13 LAB — MICROALBUMIN, URINE WAIVED
Creatinine, Urine Waived: 50 mg/dL (ref 10–300)
MICROALB, UR WAIVED: 10 mg/L (ref 0–19)

## 2016-12-13 NOTE — Assessment & Plan Note (Signed)
Stable

## 2016-12-13 NOTE — Patient Instructions (Addendum)
https://www.west-esparza.com/ sleepio  DASH Eating Plan DASH stands for "Dietary Approaches to Stop Hypertension." The DASH eating plan is a healthy eating plan that has been shown to reduce high blood pressure (hypertension). Additional health benefits may include reducing the risk of type 2 diabetes mellitus, heart disease, and stroke. The DASH eating plan may also help with weight loss. What do I need to know about the DASH eating plan? For the DASH eating plan, you will follow these general guidelines:  Choose foods with less than 150 milligrams of sodium per serving (as listed on the food label).  Use salt-free seasonings or herbs instead of table salt or sea salt.  Check with your health care provider or pharmacist before using salt substitutes.  Eat lower-sodium products. These are often labeled as "low-sodium" or "no salt added."  Eat fresh foods. Avoid eating a lot of canned foods.  Eat more vegetables, fruits, and low-fat dairy products.  Choose whole grains. Look for the word "whole" as the first word in the ingredient list.  Choose fish and skinless chicken or [place] more often than red meat. Limit fish, poultry, and meat to 6 oz (170 g) each day.  Limit sweets, desserts, sugars, and sugary drinks.  Choose heart-healthy fats.  Eat more home-cooked food and less restaurant, buffet, and fast food.  Limit fried foods.  Do not fry foods. Cook foods using methods such as baking, boiling, grilling, and broiling instead.  When eating at a restaurant, ask that your food be prepared with less salt, or no salt if possible. What foods can I eat? Seek help from a dietitian for individual calorie needs. Grains  Whole grain or whole wheat bread. Brown rice. Whole grain or whole wheat pasta. Quinoa, bulgur, and whole grain cereals. Low-sodium cereals. Corn or whole wheat flour tortillas. Whole grain cornbread. Whole grain crackers. Low-sodium crackers. Vegetables  Fresh or frozen vegetables (raw,  steamed, roasted, or grilled). Low-sodium or reduced-sodium tomato and vegetable juices. Low-sodium or reduced-sodium tomato sauce and paste. Low-sodium or reduced-sodium canned vegetables. Fruits  All fresh, canned (in natural juice), or frozen fruits. Meat and Other Protein Products  Ground beef (85% or leaner), grass-fed beef, or beef trimmed of fat. Skinless chicken or [place]. Ground chicken or [place]. Pork trimmed of fat. All fish and seafood. Eggs. Dried beans, peas, or lentils. Unsalted nuts and seeds. Unsalted canned beans. Dairy  Low-fat dairy products, such as skim or 1% milk, 2% or reduced-fat cheeses, low-fat ricotta or cottage cheese, or plain low-fat yogurt. Low-sodium or reduced-sodium cheeses. Fats and Oils  Tub margarines without trans fats. Light or reduced-fat mayonnaise and salad dressings (reduced sodium). Avocado. Safflower, olive, or canola oils. Natural peanut or almond butter. Other  Unsalted popcorn and pretzels. The items listed above may not be a complete list of recommended foods or beverages. Contact your dietitian for more options.  What foods are not recommended? Grains  White bread. White pasta. White rice. Refined cornbread. Bagels and croissants. Crackers that contain trans fat. Vegetables  Creamed or fried vegetables. Vegetables in a cheese sauce. Regular canned vegetables. Regular canned tomato sauce and paste. Regular tomato and vegetable juices. Fruits  Canned fruit in light or heavy syrup. Fruit juice. Meat and Other Protein Products  Fatty cuts of meat. Ribs, chicken wings, bacon, sausage, bologna, salami, chitterlings, fatback, hot dogs, bratwurst, and packaged luncheon meats. Salted nuts and seeds. Canned beans with salt. Dairy  Whole or 2% milk, cream, half-and-half, and cream cheese. Whole-fat or sweetened  yogurt. Full-fat cheeses or blue cheese. Nondairy creamers and whipped toppings. Processed cheese, cheese spreads, or cheese curds. Condiments   Onion and garlic salt, seasoned salt, table salt, and sea salt. Canned and packaged gravies. Worcestershire sauce. Tartar sauce. Barbecue sauce. Teriyaki sauce. Soy sauce, including reduced sodium. Steak sauce. Fish sauce. Oyster sauce. Cocktail sauce. Horseradish. Ketchup and mustard. Meat flavorings and tenderizers. Bouillon cubes. Hot sauce. Tabasco sauce. Marinades. Taco seasonings. Relishes. Fats and Oils  Butter, stick margarine, lard, shortening, ghee, and bacon fat. Coconut, palm kernel, or palm oils. Regular salad dressings. Other  Pickles and olives. Salted popcorn and pretzels. The items listed above may not be a complete list of foods and beverages to avoid. Contact your dietitian for more information.  Where can I find more information? National Heart, Lung, and Blood Institute: CablePromo.itwww.nhlbi.nih.gov/health/health-topics/topics/dash/ This information is not intended to replace advice given to you by your health care provider. Make sure you discuss any questions you have with your health care provider. Document Released: 10/12/2011 Document Revised: 03/30/2016 Document Reviewed: 08/27/2013 Elsevier Interactive Patient Education  2017 [employer]

## 2016-12-13 NOTE — Assessment & Plan Note (Signed)
Check TSH 

## 2016-12-13 NOTE — Progress Notes (Signed)
BP (!) 145/89 (BP Location: Left Arm, Patient Position: Sitting, Cuff Size: Large)   Pulse (!) 101   Temp 98.8 F (37.1 C)   Ht 5' 3.3" (1.608 m)   Wt 180 lb 12.8 oz (82 kg)   LMP 09/12/2016 (Approximate)   SpO2 98%   BMI 31.72 kg/m    Subjective:    Patient ID: Laurie Martinez, female    DOB: 07-20-1971, 46 y.o.   MRN: [id number]  HPI: Laurie Martinez is a 46 y.o. female  Chief Complaint  Patient presents with  . Annual Exam   Depression This is stable but having trouble sleeping.  Takes Clonazepam only on occasion Depression screen [facility] 2/9 12/13/2016 09/15/2016 09/07/2015  Decreased Interest 2 0 2  Down, Depressed, Hopeless 1 0 1  PHQ - 2 Score 3 0 3  Altered sleeping 3 3 3   Tired, decreased energy 2 1 1   Change in appetite 0 1 0  Feeling bad or failure about yourself  0 0 0  Trouble concentrating 1 1 0  Moving slowly or fidgety/restless 1 0 0  Suicidal thoughts 0 0 0  PHQ-9 Score 10 6 7    Insomnia Sleeps well for a couple of hours.  Takes occasional Ambien.    Hypertension Not on medications at this time with history of borderline hypertension Average home BPs 130s/80s   No problems or lightheadedness No chest pain with exertion or shortness of breath No Edema  Hypothyroid Seems to be stable.    Relevant past medical, surgical, family and social history reviewed and updated as indicated. Interim medical history since our last visit reviewed. Allergies and medications reviewed and updated.  Review of Systems  Per HPI unless specifically indicated above     Objective:    BP (!) 145/89 (BP Location: Left Arm, Patient Position: Sitting, Cuff Size: Large)   Pulse (!) 101   Temp 98.8 F (37.1 C)   Ht 5' 3.3" (1.608 m)   Wt 180 lb 12.8 oz (82 kg)   LMP 09/12/2016 (Approximate)   SpO2 98%   BMI 31.72 kg/m   Wt Readings from Last 3 Encounters:  12/13/16 180 lb 12.8 oz (82 kg)  09/15/16 186 lb (84.4 kg)  09/07/15 174 lb 3.2 oz (79 kg)    Physical Exam    Constitutional: She is oriented to person, place, and time. She appears well-developed and well-nourished.  HENT:  Head: Normocephalic and atraumatic.  Eyes: Pupils are equal, round, and reactive to light. Right eye exhibits no discharge. Left eye exhibits no discharge. No scleral icterus.  Neck: Normal range of motion. Neck supple. Carotid bruit is not present. No thyromegaly present.  Cardiovascular: Normal rate, regular rhythm and normal heart sounds.  Exam reveals no gallop and no friction rub.   No murmur heard. Pulmonary/Chest: Effort normal and breath sounds normal. No respiratory distress. She has no wheezes. She has no rales.  Abdominal: Soft. Bowel sounds are normal. There is no tenderness. There is no rebound.  Genitourinary: No breast swelling, tenderness or discharge.  Musculoskeletal: Normal range of motion.  Lymphadenopathy:    She has no cervical adenopathy.  Neurological: She is alert and oriented to person, place, and time.  Skin: Skin is warm, dry and intact. No rash noted.  Psychiatric: She has a normal mood and affect. Her speech is normal and behavior is normal. Judgment and thought content normal. Cognition and memory are normal.      Assessment & Plan:  Problem List Items Addressed This Visit      Unprioritized   Depression    Stable       Hypertension    High today.  Work on diet. DASH diet given        Relevant Orders   Comprehensive metabolic panel   Lipid Panel w/o Chol/HDL Ratio   Microalbumin, Urine Waived   Hypothyroidism    Check TSH      Relevant Orders   TSH   Insomnia    Discussed CBT strategies       Other Visit Diagnoses    Annual physical exam    -  Primary   Relevant Orders   MM DIGITAL SCREENING BILATERAL   CBC with Differential/Platelet       Follow up plan: Return in about 4 weeks (around 01/10/2017) for BP.

## 2016-12-13 NOTE — Assessment & Plan Note (Addendum)
High today.  Work on diet. DASH diet given

## 2016-12-13 NOTE — Assessment & Plan Note (Signed)
Discussed CBT strategies

## 2016-12-14 LAB — CBC WITH DIFFERENTIAL/PLATELET
BASOS ABS: 0 10*3/uL (ref 0.0–0.2)
BASOS: 0 %
EOS (ABSOLUTE): 0.1 10*3/uL (ref 0.0–0.4)
Eos: 1 %
Hematocrit: 41.2 % (ref 34.0–46.6)
Hemoglobin: 13.8 g/dL (ref 11.1–15.9)
Immature Grans (Abs): 0 10*3/uL (ref 0.0–0.1)
Immature Granulocytes: 0 %
LYMPHS ABS: 1.8 10*3/uL (ref 0.7–3.1)
Lymphs: 32 %
MCH: 31.3 pg (ref 26.6–33.0)
MCHC: 33.5 g/dL (ref 31.5–35.7)
MCV: 93 fL (ref 79–97)
MONOS ABS: 0.3 10*3/uL (ref 0.1–0.9)
Monocytes: 5 %
NEUTROS ABS: 3.4 10*3/uL (ref 1.4–7.0)
Neutrophils: 62 %
PLATELETS: 253 10*3/uL (ref 150–379)
RBC: 4.41 x10E6/uL (ref 3.77–5.28)
RDW: 12.4 % (ref 12.3–15.4)
WBC: 5.5 10*3/uL (ref 3.4–10.8)

## 2016-12-14 LAB — COMPREHENSIVE METABOLIC PANEL
A/G RATIO: 1.5 (ref 1.2–2.2)
ALK PHOS: 59 IU/L (ref 39–117)
ALT: 15 IU/L (ref 0–32)
AST: 16 IU/L (ref 0–40)
Albumin: 4.2 g/dL (ref 3.5–5.5)
BILIRUBIN TOTAL: 0.2 mg/dL (ref 0.0–1.2)
BUN / CREAT RATIO: 10 (ref 9–23)
BUN: 8 mg/dL (ref 6–24)
CHLORIDE: 101 mmol/L (ref 96–106)
CO2: 21 mmol/L (ref 18–29)
Calcium: 9.7 mg/dL (ref 8.7–10.2)
Creatinine, Ser: 0.82 mg/dL (ref 0.57–1.00)
GFR calc Af Amer: 100 mL/min/{1.73_m2} (ref >59)
GFR calc non Af Amer: 87 mL/min/{1.73_m2} (ref >59)
Globulin, Total: 2.8 g/dL (ref 1.5–4.5)
Glucose: 101 mg/dL — ABNORMAL HIGH (ref 65–99)
POTASSIUM: 4.4 mmol/L (ref 3.5–5.2)
Sodium: 139 mmol/L (ref 134–144)
TOTAL PROTEIN: 7 g/dL (ref 6.0–8.5)

## 2016-12-14 LAB — LIPID PANEL W/O CHOL/HDL RATIO
CHOLESTEROL TOTAL: 189 mg/dL (ref 100–199)
HDL: 57 mg/dL (ref >39)
LDL Calculated: 104 mg/dL — ABNORMAL HIGH (ref 0–99)
Triglycerides: 140 mg/dL (ref 0–149)
VLDL Cholesterol Cal: 28 mg/dL (ref 5–40)

## 2016-12-14 LAB — TSH: TSH: 1.95 u[IU]/mL (ref 0.450–4.500)

## 2016-12-15 ENCOUNTER — Encounter: Payer: Self-pay | Admitting: Unknown Physician Specialty

## 2016-12-15 NOTE — Progress Notes (Signed)
Normal labs.  Patient notified by letter.

## 2017-01-08 ENCOUNTER — Other Ambulatory Visit: Payer: Self-pay | Admitting: Unknown Physician Specialty

## 2017-01-10 ENCOUNTER — Encounter: Payer: Self-pay | Admitting: Unknown Physician Specialty

## 2017-01-10 ENCOUNTER — Ambulatory Visit (INDEPENDENT_AMBULATORY_CARE_PROVIDER_SITE_OTHER): Payer: Managed Care, Other (non HMO) | Admitting: Unknown Physician Specialty

## 2017-01-10 DIAGNOSIS — I10 Essential (primary) hypertension: Secondary | ICD-10-CM | POA: Diagnosis not present

## 2017-01-10 MED ORDER — LISINOPRIL 5 MG PO TABS: 5.0000 mg | ORAL_TABLET | Freq: Every day | ORAL | 3 refills | Status: DC

## 2017-01-10 NOTE — Patient Instructions (Signed)
I prefer Magnesium Citrate (I use something called Calm and Relax) or Glycinate which is better absorbed then the more common Magnesium Oxide.

## 2017-01-10 NOTE — Assessment & Plan Note (Addendum)
BP is not to goal.  Will start Lisinopril.  Recheck in 1 month

## 2017-01-10 NOTE — Progress Notes (Signed)
   BP (!) 146/95 (BP Location: Left Arm, Patient Position: Sitting, Cuff Size: Large)   Pulse 89   Temp 99 F (37.2 C)   Wt 180 lb (81.6 kg)   LMP 01/08/2017 (Exact Date)   SpO2 98%   BMI 31.58 kg/m    Subjective:    Patient ID: Laurie Martinez, female    DOB: 07/11/1971, 46 y.o.   MRN: [id number]  HPI: Laurie Martinez is a 46 y.o. female  Chief Complaint  Patient presents with  . Hypertension    4 week f/up- BP recheck was higher than first check    Hypertension Average home BPs- also high at home.    No problems or lightheadedness No chest pain with exertion or shortness of breath No Edema  Relevant past medical, surgical, family and social history reviewed and updated as indicated. Interim medical history since our last visit reviewed. Allergies and medications reviewed and updated.  Review of Systems  Per HPI unless specifically indicated above     Objective:    BP (!) 146/95 (BP Location: Left Arm, Patient Position: Sitting, Cuff Size: Large)   Pulse 89   Temp 99 F (37.2 C)   Wt 180 lb (81.6 kg)   LMP 01/08/2017 (Exact Date)   SpO2 98%   BMI 31.58 kg/m   Wt Readings from Last 3 Encounters:  01/10/17 180 lb (81.6 kg)  12/13/16 180 lb 12.8 oz (82 kg)  09/15/16 186 lb (84.4 kg)    Physical Exam  Constitutional: She is oriented to person, place, and time. She appears well-developed and well-nourished. No distress.  HENT:  Head: Normocephalic and atraumatic.  Eyes: Conjunctivae and lids are normal. Right eye exhibits no discharge. Left eye exhibits no discharge. No scleral icterus.  Neck: Normal range of motion. Neck supple. No JVD present. Carotid bruit is not present.  Cardiovascular: Normal rate, regular rhythm and normal heart sounds.   Pulmonary/Chest: Effort normal and breath sounds normal.  Abdominal: Normal appearance. There is no splenomegaly or hepatomegaly.  Musculoskeletal: Normal range of motion.  Neurological: She is alert and oriented to  person, place, and time.  Skin: Skin is warm, dry and intact. No rash noted. No pallor.  Psychiatric: She has a normal mood and affect. Her behavior is normal. Judgment and thought content normal.      Assessment & Plan:   Problem List Items Addressed This Visit      Unprioritized   Hypertension    BP is not to goal.  Will start Lisinopril.  Recheck in 1 month      Relevant Medications   lisinopril (PRINIVIL,ZESTRIL) 5 MG tablet       Follow up plan: Return in about 4 weeks (around 02/07/2017).

## 2017-02-13 ENCOUNTER — Ambulatory Visit (INDEPENDENT_AMBULATORY_CARE_PROVIDER_SITE_OTHER): Payer: Managed Care, Other (non HMO) | Admitting: Unknown Physician Specialty

## 2017-02-13 ENCOUNTER — Encounter: Payer: Self-pay | Admitting: Unknown Physician Specialty

## 2017-02-13 DIAGNOSIS — I10 Essential (primary) hypertension: Secondary | ICD-10-CM

## 2017-02-13 NOTE — Patient Instructions (Signed)
Laurie Martinez "Eat food, not too much, mostly plants"

## 2017-02-13 NOTE — Assessment & Plan Note (Signed)
Improved BP.  Not quite to goal.  She will work on diet and exercise.

## 2017-02-13 NOTE — Progress Notes (Signed)
BP 137/85 (BP Location: Left Arm, Patient Position: Sitting, Cuff Size: Normal)   Pulse 84   Temp 98.9 F (37.2 C)   Wt 179 lb 12.8 oz (81.6 kg)   LMP  (LMP Unknown)   SpO2 97%   BMI 31.55 kg/m    Subjective:    Patient ID: Laurie Martinez, female    DOB: 09-Nov-1970, 46 y.o.   MRN: [id number]  HPI: Laurie Martinez is a 46 y.o. female  Chief Complaint  Patient presents with  . Hypertension    4 week f/up   Hypertension Started Lisinopril 5 mg last visit.  Using medications without difficulty Average home BPs States she is taking her BP at home and is "green" (120/80) or "yellow" zones but not in "red" (140/90).     No problems or lightheadedness No chest pain with exertion or shortness of breath No Edema  Relevant past medical, surgical, family and social history reviewed and updated as indicated. Interim medical history since our last visit reviewed. Allergies and medications reviewed and updated.  Review of Systems  Per HPI unless specifically indicated above     Objective:    BP 137/85 (BP Location: Left Arm, Patient Position: Sitting, Cuff Size: Normal)   Pulse 84   Temp 98.9 F (37.2 C)   Wt 179 lb 12.8 oz (81.6 kg)   LMP  (LMP Unknown)   SpO2 97%   BMI 31.55 kg/m   Wt Readings from Last 3 Encounters:  02/13/17 179 lb 12.8 oz (81.6 kg)  01/10/17 180 lb (81.6 kg)  12/13/16 180 lb 12.8 oz (82 kg)    Physical Exam  Constitutional: She is oriented to person, place, and time. She appears well-developed and well-nourished. No distress.  HENT:  Head: Normocephalic and atraumatic.  Eyes: Conjunctivae and lids are normal. Right eye exhibits no discharge. Left eye exhibits no discharge. No scleral icterus.  Neck: Normal range of motion. Neck supple. No JVD present. Carotid bruit is not present.  Cardiovascular: Normal rate, regular rhythm and normal heart sounds.   Pulmonary/Chest: Effort normal and breath sounds normal.  Abdominal: Normal appearance. There is  no splenomegaly or hepatomegaly.  Musculoskeletal: Normal range of motion.  Neurological: She is alert and oriented to person, place, and time.  Skin: Skin is warm, dry and intact. No rash noted. No pallor.  Psychiatric: She has a normal mood and affect. Her behavior is normal. Judgment and thought content normal.    Results for orders placed or performed in visit on 12/13/16  TSH  Result Value Ref Range   TSH 1.950 0.450 - 4.500 uIU/mL  CBC with Differential/Platelet  Result Value Ref Range   WBC 5.5 3.4 - 10.8 x10E3/uL   RBC 4.41 3.77 - 5.28 x10E6/uL   Hemoglobin 13.8 11.1 - 15.9 g/dL   Hematocrit 84.6 96.2 - 46.6 %   MCV 93 79 - 97 fL   MCH 31.3 26.6 - 33.0 pg   MCHC 33.5 31.5 - 35.7 g/dL   RDW 95.2 84.1 - 32.4 %   Platelets 253 150 - 379 x10E3/uL   Neutrophils 62 Not Estab. %   Lymphs 32 Not Estab. %   Monocytes 5 Not Estab. %   Eos 1 Not Estab. %   Basos 0 Not Estab. %   Neutrophils Absolute 3.4 1.4 - 7.0 x10E3/uL   Lymphocytes Absolute 1.8 0.7 - 3.1 x10E3/uL   Monocytes Absolute 0.3 0.1 - 0.9 x10E3/uL   EOS (ABSOLUTE) 0.1 0.0 -  0.4 x10E3/uL   Basophils Absolute 0.0 0.0 - 0.2 x10E3/uL   Immature Granulocytes 0 Not Estab. %   Immature Grans (Abs) 0.0 0.0 - 0.1 x10E3/uL  Comprehensive metabolic panel  Result Value Ref Range   Glucose 101 (H) 65 - 99 mg/dL   BUN 8 6 - 24 mg/dL   Creatinine, Ser 9.14 0.57 - 1.00 mg/dL   GFR calc non Af Amer 87 >59 mL/min/1.73   GFR calc Af Amer 100 >59 mL/min/1.73   BUN/Creatinine Ratio 10 9 - 23   Sodium 139 134 - 144 mmol/L   Potassium 4.4 3.5 - 5.2 mmol/L   Chloride 101 96 - 106 mmol/L   CO2 21 18 - 29 mmol/L   Calcium 9.7 8.7 - 10.2 mg/dL   Total Protein 7.0 6.0 - 8.5 g/dL   Albumin 4.2 3.5 - 5.5 g/dL   Globulin, Total 2.8 1.5 - 4.5 g/dL   Albumin/Globulin Ratio 1.5 1.2 - 2.2   Bilirubin Total 0.2 0.0 - 1.2 mg/dL   Alkaline Phosphatase 59 39 - 117 IU/L   AST 16 0 - 40 IU/L   ALT 15 0 - 32 IU/L  Lipid Panel w/o Chol/HDL  Ratio  Result Value Ref Range   Cholesterol, Total 189 100 - 199 mg/dL   Triglycerides 782 0 - 149 mg/dL   HDL 57 >95 mg/dL   VLDL Cholesterol Cal 28 5 - 40 mg/dL   LDL Calculated 621 (H) 0 - 99 mg/dL  Microalbumin, Urine Waived  Result Value Ref Range   Microalb, Ur Waived 10 0 - 19 mg/L   Creatinine, Urine Waived 50 10 - 300 mg/dL   Microalb/Creat Ratio 30-300 (H) <30 mg/g      Assessment & Plan:   Problem List Items Addressed This Visit      Unprioritized   Hypertension    Improved BP.  Not quite to goal.  She will work on diet and exercise.        Relevant Orders   Comprehensive metabolic panel       Follow up plan: Return in about 6 months (around 08/15/2017).

## 2017-02-14 ENCOUNTER — Encounter: Payer: Self-pay | Admitting: Unknown Physician Specialty

## 2017-02-14 LAB — COMPREHENSIVE METABOLIC PANEL
ALK PHOS: 58 IU/L (ref 39–117)
ALT: 14 IU/L (ref 0–32)
AST: 14 IU/L (ref 0–40)
Albumin/Globulin Ratio: 1.4 (ref 1.2–2.2)
Albumin: 4.1 g/dL (ref 3.5–5.5)
BUN/Creatinine Ratio: 14 (ref 9–23)
BUN: 11 mg/dL (ref 6–24)
Bilirubin Total: 0.2 mg/dL (ref 0.0–1.2)
CO2: 25 mmol/L (ref 18–29)
CREATININE: 0.8 mg/dL (ref 0.57–1.00)
Calcium: 10.3 mg/dL — ABNORMAL HIGH (ref 8.7–10.2)
Chloride: 101 mmol/L (ref 96–106)
GFR calc Af Amer: 103 mL/min/{1.73_m2} (ref >59)
GFR calc non Af Amer: 89 mL/min/{1.73_m2} (ref >59)
GLOBULIN, TOTAL: 2.9 g/dL (ref 1.5–4.5)
GLUCOSE: 88 mg/dL (ref 65–99)
POTASSIUM: 4.8 mmol/L (ref 3.5–5.2)
SODIUM: 140 mmol/L (ref 134–144)
Total Protein: 7 g/dL (ref 6.0–8.5)

## 2017-04-13 ENCOUNTER — Other Ambulatory Visit: Payer: Self-pay | Admitting: Unknown Physician Specialty

## 2017-05-30 ENCOUNTER — Other Ambulatory Visit: Payer: Self-pay | Admitting: Unknown Physician Specialty

## 2017-05-31 ENCOUNTER — Ambulatory Visit
Admission: RE | Admit: 2017-05-31 | Discharge: 2017-05-31 | Disposition: A | Discharge status: Home / Self Care | Payer: Managed Care, Other (non HMO) | Source: Ambulatory Visit | Attending: Unknown Physician Specialty | Admitting: Unknown Physician Specialty

## 2017-05-31 ENCOUNTER — Encounter: Payer: Self-pay | Admitting: Radiology

## 2017-05-31 DIAGNOSIS — Z1231 Encounter for screening mammogram for malignant neoplasm of breast: Secondary | ICD-10-CM | POA: Diagnosis present

## 2017-05-31 DIAGNOSIS — Z0000 Encounter for general adult medical examination without abnormal findings: Secondary | ICD-10-CM

## 2017-08-15 ENCOUNTER — Encounter: Payer: Self-pay | Admitting: Unknown Physician Specialty

## 2017-08-15 ENCOUNTER — Ambulatory Visit (INDEPENDENT_AMBULATORY_CARE_PROVIDER_SITE_OTHER): Payer: Managed Care, Other (non HMO) | Admitting: Unknown Physician Specialty

## 2017-08-15 DIAGNOSIS — G43909 Migraine, unspecified, not intractable, without status migrainosus: Secondary | ICD-10-CM

## 2017-08-15 DIAGNOSIS — I10 Essential (primary) hypertension: Secondary | ICD-10-CM | POA: Diagnosis not present

## 2017-08-15 DIAGNOSIS — J45909 Unspecified asthma, uncomplicated: Secondary | ICD-10-CM | POA: Diagnosis not present

## 2017-08-15 DIAGNOSIS — F329 Major depressive disorder, single episode, unspecified: Secondary | ICD-10-CM

## 2017-08-15 DIAGNOSIS — F32A Depression, unspecified: Secondary | ICD-10-CM

## 2017-08-15 DIAGNOSIS — F5101 Primary insomnia: Secondary | ICD-10-CM | POA: Diagnosis not present

## 2017-08-15 MED ORDER — SUMATRIPTAN 20 MG/ACT NA SOLN: 20.0000 mg | Freq: Once | NASAL | 3 refills | Status: DC

## 2017-08-15 MED ORDER — LEVOTHYROXINE SODIUM 50 MCG PO TABS: 50.0000 ug | ORAL_TABLET | Freq: Every day | ORAL | 3 refills | Status: DC

## 2017-08-15 MED ORDER — LISINOPRIL 5 MG PO TABS: 5.0000 mg | ORAL_TABLET | Freq: Every day | ORAL | 3 refills | Status: DC

## 2017-08-15 MED ORDER — ALBUTEROL SULFATE HFA 108 (90 BASE) MCG/ACT IN AERS
2.0000 | INHALATION_SPRAY | RESPIRATORY_TRACT | 0 refills | Status: DC | PRN
Start: 2017-08-15 — End: 2017-12-19

## 2017-08-15 MED ORDER — CLONAZEPAM 0.5 MG PO TABS: 0.5000 mg | ORAL_TABLET | Freq: Every day | ORAL | 0 refills | Status: DC | PRN

## 2017-08-15 MED ORDER — DESOGESTREL-ETHINYL ESTRADIOL 0.15-30 MG-MCG PO TABS: 1.0000 | ORAL_TABLET | Freq: Every day | ORAL | 3 refills | Status: DC

## 2017-08-15 MED ORDER — ZOLPIDEM TARTRATE 10 MG PO TABS: 10.0000 mg | ORAL_TABLET | Freq: Every evening | ORAL | 0 refills | Status: DC | PRN

## 2017-08-15 MED ORDER — MONTELUKAST SODIUM 10 MG PO TABS: 10.0000 mg | ORAL_TABLET | Freq: Every day | ORAL | 3 refills | Status: DC

## 2017-08-15 MED ORDER — HYDROCORTISONE 2.5 % RE CREA: 1.0000 "application " | TOPICAL_CREAM | Freq: Two times a day (BID) | RECTAL | 12 refills | Status: AC

## 2017-08-15 MED ORDER — FLUTICASONE-SALMETEROL 250-50 MCG/DOSE IN AEPB: 1.0000 | INHALATION_SPRAY | Freq: Two times a day (BID) | RESPIRATORY_TRACT | 3 refills | Status: DC

## 2017-08-15 NOTE — Progress Notes (Signed)
BP 140/84   Pulse 84   Temp 98.5 F (36.9 C)   Ht 5' 4.2" (1.631 m)   Wt 184 lb 3.2 oz (83.6 kg)   LMP  (LMP Unknown)   SpO2 98%   BMI 31.42 kg/m    Subjective:    Patient ID: Laurie Martinez, female    DOB: Jul 17, 1971, 46 y.o.   MRN: [id number]  HPI: Laurie Martinez is a 46 y.o. female  Chief Complaint  Patient presents with  . Depression  . Hypertension  . Hypothyroidism   Hypertension Using medications without difficulty Average home BPs Usually below 120/80   No problems or lightheadedness No chest pain with exertion or shortness of breath No Edema  The 10-year ASCVD risk score [name], et al., 2013) is: 1.1%   Values used to calculate the score:     Age: 61 years     Sex: Female     Is Non-Hispanic African American: No     Diabetic: No     Tobacco smoker: No     Systolic Blood Pressure: 140 mmHg     Is BP treated: Yes     HDL Cholesterol: 57 mg/dL     Total Cholesterol: 189 mg/dL   Depression She has been under a lot of stress.  2 weeks ago realized about life stressors.  She is trying to think about the big picture.     Depression screen [facility] 2/9 08/15/2017 12/13/2016 09/15/2016 09/07/2015  Decreased Interest 1 2 0 2  Down, Depressed, Hopeless 1 1 0 1  PHQ - 2 Score 2 3 0 3  Altered sleeping Tired, decreased energy Change in appetite 1 0 1 0  Feeling bad or failure about yourself  0 0 0 0  Trouble concentrating 0  Moving slowly or fidgety/restless 0 1 0 0  Suicidal thoughts 0 0 0 0  PHQ-9 Score Insomnia She takes Ambien only on occasion. Takes Clonazepam about 1-2 times/month  Migraines Finding they are less  Asthma Occasional use of inhaler   Relevant past medical, surgical, family and social history reviewed and updated as indicated. Interim medical history since our last visit reviewed. Allergies and medications reviewed and updated.  Review of Systems  Per HPI unless specifically indicated above     Objective:    BP 140/84   Pulse 84   Temp 98.5 F (36.9 C)   Ht 5' 4.2" (1.631 m)   Wt 184 lb 3.2 oz (83.6 kg)   LMP  (LMP Unknown)   SpO2 98%   BMI 31.42 kg/m   Wt Readings from Last 3 Encounters:  08/15/17 184 lb 3.2 oz (83.6 kg)  02/13/17 179 lb 12.8 oz (81.6 kg)  01/10/17 180 lb (81.6 kg)    Physical Exam  Constitutional: She is oriented to person, place, and time. She appears well-developed and well-nourished. No distress.  HENT:  Head: Normocephalic and atraumatic.  Eyes: Conjunctivae and lids are normal. Right eye exhibits no discharge. Left eye exhibits no discharge. No scleral icterus.  Neck: Normal range of motion. Neck supple. No JVD present. Carotid bruit is not present.  Cardiovascular: Normal rate, regular rhythm and normal heart sounds.   Pulmonary/Chest: Effort normal and breath sounds normal.  Abdominal: Normal appearance. There is no splenomegaly or hepatomegaly.  Musculoskeletal: Normal range of motion.  Neurological: She is alert and oriented  to person, place, and time.  Skin: Skin is warm, dry and intact. No rash noted. No pallor.  Psychiatric: She has a normal mood and affect. Her behavior is normal. Judgment and thought content normal.    Results for orders placed or performed in visit on 02/13/17  Comprehensive metabolic panel  Result Value Ref Range   Glucose 88 65 - 99 mg/dL   BUN 11 6 - 24 mg/dL   Creatinine, Ser 1.61 0.57 - 1.00 mg/dL   GFR calc non Af Amer 89 >59 mL/min/1.73   GFR calc Af Amer 103 >59 mL/min/1.73   BUN/Creatinine Ratio 14 9 - 23   Sodium 140 134 - 144 mmol/L   Potassium 4.8 3.5 - 5.2 mmol/L   Chloride 101 96 - 106 mmol/L   CO2 25 18 - 29 mmol/L   Calcium 10.3 (H) 8.7 - 10.2 mg/dL   Total Protein 7.0 6.0 - 8.5 g/dL   Albumin 4.1 3.5 - 5.5 g/dL   Globulin, Total 2.9 1.5 - 4.5 g/dL   Albumin/Globulin Ratio 1.4 1.2 - 2.2   Bilirubin Total 0.2 0.0 - 1.2 mg/dL   Alkaline Phosphatase 58 39 - 117 IU/L   AST 14 0 - 40 IU/L    ALT 14 0 - 32 IU/L      Assessment & Plan:   Problem List Items Addressed This Visit      Unprioritized   Asthma    Stable, continue present medications.        Relevant Medications   albuterol (PROVENTIL HFA;VENTOLIN HFA) 108 (90 Base) MCG/ACT inhaler   Fluticasone-Salmeterol (ADVAIR DISKUS) 250-50 MCG/DOSE AEPB   montelukast (SINGULAIR) 10 MG tablet   Depression    Discussed whole life methods of stress.  Materials given      Hypertension    Stable at home.  Continue present meds      Relevant Medications   lisinopril (PRINIVIL,ZESTRIL) 5 MG tablet   Insomnia    Discussed CBT for sleep      Migraines    Getting much less      Relevant Medications   lisinopril (PRINIVIL,ZESTRIL) 5 MG tablet   clonazePAM (KLONOPIN) 0.5 MG tablet   SUMAtriptan (IMITREX) 20 MG/ACT nasal spray       Follow up plan: Return in about 6 months (around 02/13/2018).

## 2017-08-15 NOTE — Assessment & Plan Note (Signed)
Discussed CBT for sleep 

## 2017-08-15 NOTE — Assessment & Plan Note (Signed)
Stable, continue present medications.   

## 2017-08-15 NOTE — Assessment & Plan Note (Signed)
Discussed whole life methods of stress.  Materials given

## 2017-08-15 NOTE — Patient Instructions (Signed)
CBT for sleep: [email or web address]

## 2017-08-15 NOTE — Assessment & Plan Note (Signed)
Stable at home.  Continue present meds 

## 2017-08-15 NOTE — Assessment & Plan Note (Signed)
Getting much less

## 2017-08-16 ENCOUNTER — Telehealth: Payer: Self-pay | Admitting: Unknown Physician Specialty

## 2017-08-16 NOTE — Telephone Encounter (Signed)
Marchelle Folks with [facility] called needing someone to change the quantity of the nasal spray to the box of 6 because it cannot be broken.   Marchelle Folks [phone]  Thank You

## 2017-08-20 MED ORDER — SUMATRIPTAN 20 MG/ACT NA SOLN: 20.0000 mg | Freq: Once | NASAL | 3 refills | Status: DC

## 2017-08-20 NOTE — Telephone Encounter (Signed)
Routing to provider for prescription change, Imitrex nasal spray.

## 2017-12-19 ENCOUNTER — Encounter: Payer: Self-pay | Admitting: Unknown Physician Specialty

## 2017-12-19 ENCOUNTER — Ambulatory Visit (INDEPENDENT_AMBULATORY_CARE_PROVIDER_SITE_OTHER): Payer: Managed Care, Other (non HMO) | Admitting: Unknown Physician Specialty

## 2017-12-19 VITALS — BP 137/82 | HR 81 | Temp 98.4°F | Ht 63.1 in | Wt 183.0 lb

## 2017-12-19 DIAGNOSIS — E6609 Other obesity due to excess calories: Secondary | ICD-10-CM | POA: Diagnosis not present

## 2017-12-19 DIAGNOSIS — Z6830 Body mass index (BMI) 30.0-30.9, adult: Secondary | ICD-10-CM

## 2017-12-19 DIAGNOSIS — F5101 Primary insomnia: Secondary | ICD-10-CM

## 2017-12-19 DIAGNOSIS — I10 Essential (primary) hypertension: Secondary | ICD-10-CM

## 2017-12-19 DIAGNOSIS — G43909 Migraine, unspecified, not intractable, without status migrainosus: Secondary | ICD-10-CM

## 2017-12-19 DIAGNOSIS — Z0001 Encounter for general adult medical examination with abnormal findings: Secondary | ICD-10-CM | POA: Diagnosis not present

## 2017-12-19 DIAGNOSIS — E66811 Obesity, class 1: Secondary | ICD-10-CM

## 2017-12-19 DIAGNOSIS — J45909 Unspecified asthma, uncomplicated: Secondary | ICD-10-CM | POA: Diagnosis not present

## 2017-12-19 DIAGNOSIS — E039 Hypothyroidism, unspecified: Secondary | ICD-10-CM | POA: Diagnosis not present

## 2017-12-19 DIAGNOSIS — Z0000 Encounter for general adult medical examination without abnormal findings: Secondary | ICD-10-CM

## 2017-12-19 MED ORDER — MONTELUKAST SODIUM 10 MG PO TABS: 10.0000 mg | ORAL_TABLET | Freq: Every day | ORAL | 3 refills | Status: DC

## 2017-12-19 MED ORDER — ALBUTEROL SULFATE HFA 108 (90 BASE) MCG/ACT IN AERS: 2.0000 | INHALATION_SPRAY | RESPIRATORY_TRACT | 0 refills | Status: DC | PRN

## 2017-12-19 MED ORDER — LEVOTHYROXINE SODIUM 50 MCG PO TABS: 50.0000 ug | ORAL_TABLET | Freq: Every day | ORAL | 3 refills | Status: DC

## 2017-12-19 MED ORDER — ZOLPIDEM TARTRATE 10 MG PO TABS: 10.0000 mg | ORAL_TABLET | Freq: Every evening | ORAL | 0 refills | Status: DC | PRN

## 2017-12-19 MED ORDER — DESOGESTREL-ETHINYL ESTRADIOL 0.15-30 MG-MCG PO TABS: 1.0000 | ORAL_TABLET | Freq: Every day | ORAL | 3 refills | Status: DC

## 2017-12-19 MED ORDER — LISINOPRIL 5 MG PO TABS: 5.0000 mg | ORAL_TABLET | Freq: Every day | ORAL | 3 refills | Status: DC

## 2017-12-19 MED ORDER — SUMATRIPTAN 20 MG/ACT NA SOLN: 20.0000 mg | Freq: Once | NASAL | 3 refills | Status: DC

## 2017-12-19 MED ORDER — FLUTICASONE-SALMETEROL 250-50 MCG/DOSE IN AEPB: 1.0000 | INHALATION_SPRAY | Freq: Two times a day (BID) | RESPIRATORY_TRACT | 3 refills | Status: DC

## 2017-12-19 MED ORDER — CLONAZEPAM 0.5 MG PO TABS: 0.5000 mg | ORAL_TABLET | Freq: Every day | ORAL | 0 refills | Status: DC | PRN

## 2017-12-19 NOTE — Assessment & Plan Note (Signed)
Check TSH 

## 2017-12-19 NOTE — Assessment & Plan Note (Addendum)
Stable, continue present medications. Discussed goal BP of below 130.  Usually in 120s at home

## 2017-12-19 NOTE — Patient Instructions (Addendum)
Preventive Care 40-64 Years, Female Preventive care refers to lifestyle choices and visits with your health care provider that can promote health and wellness. What does preventive care include?  A yearly physical exam. This is also called an annual well check.  Dental exams once or twice a year.  Routine eye exams. Ask your health care provider how often you should have your eyes checked.  Personal lifestyle choices, including: ? Daily care of your teeth and gums. ? Regular physical activity. ? Eating a healthy diet. ? Avoiding tobacco and drug use. ? Limiting alcohol use. ? Practicing safe sex. ? Taking low-dose aspirin daily starting at age 58. ? Taking vitamin and mineral supplements as recommended by your health care provider. What happens during an annual well check? The services and screenings done by your health care provider during your annual well check will depend on your age, overall health, lifestyle risk factors, and family history of disease. Counseling Your health care provider may ask you questions about your:  Alcohol use.  Tobacco use.  Drug use.  Emotional well-being.  Home and relationship well-being.  Sexual activity.  Eating habits.  Work and work Statistician.  Method of birth control.  Menstrual cycle.  Pregnancy history.  Screening You may have the following tests or measurements:  Height, weight, and BMI.  Blood pressure.  Lipid and cholesterol levels. These may be checked every 5 years, or more frequently if you are over 81 years old.  Skin check.  Lung cancer screening. You may have this screening every year starting at age 78 if you have a 30-pack-year history of smoking and currently smoke or have quit within the past 15 years.  Fecal occult blood test (FOBT) of the stool. You may have this test every year starting at age 65.  Flexible sigmoidoscopy or colonoscopy. You may have a sigmoidoscopy every 5 years or a colonoscopy  every 10 years starting at age 30.  Hepatitis C blood test.  Hepatitis B blood test.  Sexually transmitted disease (STD) testing.  Diabetes screening. This is done by checking your blood sugar (glucose) after you have not eaten for a while (fasting). You may have this done every 1-3 years.  Mammogram. This may be done every 1-2 years. Talk to your health care provider about when you should start having regular mammograms. This may depend on whether you have a family history of breast cancer.  BRCA-related cancer screening. This may be done if you have a family history of breast, ovarian, tubal, or peritoneal cancers.  Pelvic exam and Pap test. This may be done every 3 years starting at age 80. Starting at age 36, this may be done every 5 years if you have a Pap test in combination with an HPV test.  Bone density scan. This is done to screen for osteoporosis. You may have this scan if you are at high risk for osteoporosis.  Discuss your test results, treatment options, and if necessary, the need for more tests with your health care provider. Vaccines Your health care provider may recommend certain vaccines, such as:  Influenza vaccine. This is recommended every year.  Tetanus, diphtheria, and acellular pertussis (Tdap, Td) vaccine. You may need a Td booster every 10 years.  Varicella vaccine. You may need this if you have not been vaccinated.  Zoster vaccine. You may need this after age 5.  Measles, mumps, and rubella (MMR) vaccine. You may need at least one dose of MMR if you were born in  1957 or later. You may also need a second dose.  Pneumococcal 13-valent conjugate (PCV13) vaccine. You may need this if you have certain conditions and were not previously vaccinated.  Pneumococcal polysaccharide (PPSV23) vaccine. You may need one or two doses if you smoke cigarettes or if you have certain conditions.  Meningococcal vaccine. You may need this if you have certain  conditions.  Hepatitis A vaccine. You may need this if you have certain conditions or if you travel or work in places where you may be exposed to hepatitis A.  Hepatitis B vaccine. You may need this if you have certain conditions or if you travel or work in places where you may be exposed to hepatitis B.  Haemophilus influenzae type b (Hib) vaccine. You may need this if you have certain conditions.  Talk to your health care provider about which screenings and vaccines you need and how often you need them. This information is not intended to replace advice given to you by your health care provider. Make sure you discuss any questions you have with your health care provider. ---------------------------------------------------------------- Please do call to schedule your mammogram; the number to schedule one at either [facility] or [facility] is [phone]   

## 2017-12-19 NOTE — Assessment & Plan Note (Signed)
Stable, continue present medications.   

## 2017-12-19 NOTE — Assessment & Plan Note (Signed)
Takes occasional Ambien.  Working on CBT for sleep which is helping.

## 2017-12-19 NOTE — Progress Notes (Signed)
BP 137/82   Pulse 81   Temp 98.4 F (36.9 C) (Oral)   Ht 5' 3.1" (1.603 m)   Wt 183 lb (83 kg)   SpO2 98%   BMI 32.31 kg/m    Subjective:    Patient ID: Laurie Martinez, female    DOB: [birth year], 47 y.o.   MRN: [id number]  HPI: Laurie Martinez is a 47 y.o. female  Chief Complaint  Patient presents with  . Annual Exam    Depression Feels she is doing well.  Taking rare Clonazepam.   Depression screen [facility] 2/9 12/19/2017 08/15/2017 12/13/2016 09/15/2016 09/07/2015  Decreased Interest 0 1 2 0 2  Down, Depressed, Hopeless 0 1 1 0 1  PHQ - 2 Score 0 2 3 0 3  Altered sleeping 3 3 3 3 3   Tired, decreased energy 1 2 2 1 1   Change in appetite 0 1 0 1 0  Feeling bad or failure about yourself  0 0 0 0 0  Trouble concentrating 1 1 1 1  0  Moving slowly or fidgety/restless 0 0 1 0 0  Suicidal thoughts 0 0 0 0 0  PHQ-9 Score 5 9 10 6 7    Insomnia Pt states she is working through her insomnia issues with CBT.    Hypertension Using medications without difficulty Average home BPs SBP 120s   No problems or lightheadedness No chest pain with exertion or shortness of breath No Edema  ASTHMA Symptoms are well controlled Using medications without problems: Night time symptoms:None Wheeze/SOB:None  Hypothyroid No weight or weight changes  Social History   Socioeconomic History  . Marital status: Single    Spouse name: Not on file  . Number of children: Not on file  . Years of education: Not on file  . Highest education level: Not on file  Social Needs  . Financial resource strain: Not on file  . Food insecurity - worry: Not on file  . Food insecurity - inability: Not on file  . Transportation needs - medical: Not on file  . Transportation needs - non-medical: Not on file  Occupational History  . Not on file  Tobacco Use  . Smoking status: Never Smoker  . Smokeless tobacco: Never Used  Substance and Sexual Activity  . Alcohol use: No    Alcohol/week: 0.0 oz  . Drug  use: No  . Sexual activity: No  Other Topics Concern  . Not on file  Social History Narrative  . Not on file   Family History  Problem Relation Age of Onset  . Depression Mother   . Migraines Sister   . Breast cancer Maternal Grandmother    Past Medical History:  Diagnosis Date  . Asthma   . Constipation   . Depression   . Hypertension   . Insomnia    History reviewed. No pertinent surgical history.  Relevant past medical, surgical, family and social history reviewed and updated as indicated. Interim medical history since our last visit reviewed. Allergies and medications reviewed and updated.  Review of Systems  Constitutional: Negative.   HENT: Negative.   Eyes: Negative.   Respiratory: Negative.   Cardiovascular: Negative.   Gastrointestinal: Negative.   Endocrine: Negative.   Genitourinary: Negative.   Musculoskeletal: Negative.   Neurological: Negative.   Psychiatric/Behavioral: Negative.     Per HPI unless specifically indicated above     Objective:    BP 137/82   Pulse 81   Temp 98.4 F (36.9 C) (  Oral)   Ht 5' 3.1" (1.603 m)   Wt 183 lb (83 kg)   SpO2 98%   BMI 32.31 kg/m   Wt Readings from Last 3 Encounters:  12/19/17 183 lb (83 kg)  08/15/17 184 lb 3.2 oz (83.6 kg)  02/13/17 179 lb 12.8 oz (81.6 kg)    Physical Exam  Constitutional: She is oriented to person, place, and time. She appears well-developed and well-nourished.  HENT:  Head: Normocephalic and atraumatic.  Eyes: Pupils are equal, round, and reactive to light. Right eye exhibits no discharge. Left eye exhibits no discharge. No scleral icterus.  Neck: Normal range of motion. Neck supple. Carotid bruit is not present. No thyromegaly present.  Cardiovascular: Normal rate, regular rhythm and normal heart sounds. Exam reveals no gallop and no friction rub.  No murmur heard. Pulmonary/Chest: Effort normal and breath sounds normal. No respiratory distress. She has no wheezes. She has no  rales.  Abdominal: Soft. Bowel sounds are normal. There is no tenderness. There is no rebound.  Genitourinary: No breast swelling, tenderness or discharge.  Musculoskeletal: Normal range of motion.  Lymphadenopathy:    She has no cervical adenopathy.  Neurological: She is alert and oriented to person, place, and time.  Skin: Skin is warm, dry and intact. No rash noted.  Psychiatric: She has a normal mood and affect. Her speech is normal and behavior is normal. Judgment and thought content normal. Cognition and memory are normal.   Shared decision making and refusing pap smear  Results for orders placed or performed in visit on 02/13/17  Comprehensive metabolic panel  Result Value Ref Range   Glucose 88 65 - 99 mg/dL   BUN 11 6 - 24 mg/dL   Creatinine, Ser 1.610.80 0.57 - 1.00 mg/dL   GFR calc non Af Amer 89 >59 mL/min/1.73   GFR calc Af Amer 103 >59 mL/min/1.73   BUN/Creatinine Ratio 14 9 - 23   Sodium 140 134 - 144 mmol/L   Potassium 4.8 3.5 - 5.2 mmol/L   Chloride 101 96 - 106 mmol/L   CO2 25 18 - 29 mmol/L   Calcium 10.3 (H) 8.7 - 10.2 mg/dL   Total Protein 7.0 6.0 - 8.5 g/dL   Albumin 4.1 3.5 - 5.5 g/dL   Globulin, Total 2.9 1.5 - 4.5 g/dL   Albumin/Globulin Ratio 1.4 1.2 - 2.2   Bilirubin Total 0.2 0.0 - 1.2 mg/dL   Alkaline Phosphatase 58 39 - 117 IU/L   AST 14 0 - 40 IU/L   ALT 14 0 - 32 IU/L      Assessment & Plan:   Problem List Items Addressed This Visit      Unprioritized   Asthma    Stable, continue present medications.        Relevant Medications   albuterol (PROVENTIL HFA;VENTOLIN HFA) 108 (90 Base) MCG/ACT inhaler   montelukast (SINGULAIR) 10 MG tablet   Fluticasone-Salmeterol (ADVAIR DISKUS) 250-50 MCG/DOSE AEPB   Hypertension - Primary    Stable, continue present medications. Discussed goal BP of below 130.  Usually in 120s at home       Relevant Medications   lisinopril (PRINIVIL,ZESTRIL) 5 MG tablet   Other Relevant Orders   Lipid Panel w/o  Chol/HDL Ratio   Hypothyroidism    Check TSH      Relevant Medications   levothyroxine (SYNTHROID, LEVOTHROID) 50 MCG tablet   Insomnia    Takes occasional Ambien.  Working on CBT for sleep which is helping.  Migraines    Much improved.  Continue present medications      Relevant Medications   SUMAtriptan (IMITREX) 20 MG/ACT nasal spray   lisinopril (PRINIVIL,ZESTRIL) 5 MG tablet   clonazePAM (KLONOPIN) 0.5 MG tablet   Obesity   Relevant Orders   Hgb A1c w/o eAG    Other Visit Diagnoses    Routine general medical examination at a health care facility       Relevant Orders   MM DIGITAL SCREENING BILATERAL   TSH   Comprehensive metabolic panel   CBC with Differential/Platelet       Follow up plan: Return in about 6 months (around 06/18/2018).

## 2017-12-19 NOTE — Assessment & Plan Note (Signed)
Much improved.  Continue present medications.    

## 2017-12-20 LAB — HGB A1C W/O EAG: HEMOGLOBIN A1C: 5.3 % (ref 4.8–5.6)

## 2017-12-20 LAB — CBC WITH DIFFERENTIAL/PLATELET
BASOS: 0 %
Basophils Absolute: 0 10*3/uL (ref 0.0–0.2)
EOS (ABSOLUTE): 0.1 10*3/uL (ref 0.0–0.4)
Eos: 1 %
Hematocrit: 40 % (ref 34.0–46.6)
Hemoglobin: 14.1 g/dL (ref 11.1–15.9)
Immature Grans (Abs): 0 10*3/uL (ref 0.0–0.1)
Immature Granulocytes: 0 %
LYMPHS ABS: 2.5 10*3/uL (ref 0.7–3.1)
Lymphs: 35 %
MCH: 32.5 pg (ref 26.6–33.0)
MCHC: 35.3 g/dL (ref 31.5–35.7)
MCV: 92 fL (ref 79–97)
MONOS ABS: 0.1 10*3/uL (ref 0.1–0.9)
Monocytes: 1 %
NEUTROS ABS: 4.4 10*3/uL (ref 1.4–7.0)
Neutrophils: 63 %
PLATELETS: 245 10*3/uL (ref 150–379)
RBC: 4.34 x10E6/uL (ref 3.77–5.28)
RDW: 12.5 % (ref 12.3–15.4)
WBC: 7.1 10*3/uL (ref 3.4–10.8)

## 2017-12-20 LAB — LIPID PANEL W/O CHOL/HDL RATIO
CHOLESTEROL TOTAL: 179 mg/dL (ref 100–199)
HDL: 51 mg/dL (ref >39)
LDL Calculated: 101 mg/dL — ABNORMAL HIGH (ref 0–99)
TRIGLYCERIDES: 137 mg/dL (ref 0–149)
VLDL CHOLESTEROL CAL: 27 mg/dL (ref 5–40)

## 2017-12-20 LAB — COMPREHENSIVE METABOLIC PANEL
A/G RATIO: 1.3 (ref 1.2–2.2)
ALT: 16 IU/L (ref 0–32)
AST: 15 IU/L (ref 0–40)
Albumin: 4.3 g/dL (ref 3.5–5.5)
Alkaline Phosphatase: 62 IU/L (ref 39–117)
BUN/Creatinine Ratio: 10 (ref 9–23)
BUN: 8 mg/dL (ref 6–24)
Bilirubin Total: 0.3 mg/dL (ref 0.0–1.2)
CALCIUM: 9.6 mg/dL (ref 8.7–10.2)
CO2: 19 mmol/L — ABNORMAL LOW (ref 20–29)
Chloride: 102 mmol/L (ref 96–106)
Creatinine, Ser: 0.8 mg/dL (ref 0.57–1.00)
GFR, EST AFRICAN AMERICAN: 102 mL/min/{1.73_m2} (ref >59)
GFR, EST NON AFRICAN AMERICAN: 89 mL/min/{1.73_m2} (ref >59)
GLOBULIN, TOTAL: 3.2 g/dL (ref 1.5–4.5)
Glucose: 93 mg/dL (ref 65–99)
POTASSIUM: 4.2 mmol/L (ref 3.5–5.2)
SODIUM: 138 mmol/L (ref 134–144)
Total Protein: 7.5 g/dL (ref 6.0–8.5)

## 2017-12-20 LAB — TSH: TSH: 1.28 u[IU]/mL (ref 0.450–4.500)

## 2017-12-21 ENCOUNTER — Encounter: Payer: Self-pay | Admitting: Unknown Physician Specialty

## 2018-02-08 ENCOUNTER — Telehealth: Payer: Self-pay | Admitting: Unknown Physician Specialty

## 2018-02-08 NOTE — Telephone Encounter (Signed)
Called and spoke to patient. I let her know that I have her form ready, just need to add her waist circumference when she gets here.

## 2018-02-08 NOTE — Telephone Encounter (Signed)
Copied from CRM [phone]. Topic: Inquiry >> Feb 08, 2018  1:12 PM Maia Pettiesrtiz, Kristie S wrote: Reason for CRM: Pt calling to check on status of biometric screening form for employer/insurance company that she dropped off last week. She was wanting to come to pick up if possible. Please call her back at home # and leave a detailed msg for her. If she needs to bring in another form please let her know that. Pt having trouble with her cell phone if we've tried to call it.

## 2018-05-22 ENCOUNTER — Encounter: Payer: Self-pay | Admitting: Unknown Physician Specialty

## 2018-06-03 ENCOUNTER — Other Ambulatory Visit: Payer: Self-pay | Admitting: Unknown Physician Specialty

## 2018-06-03 ENCOUNTER — Ambulatory Visit
Admission: RE | Admit: 2018-06-03 | Discharge: 2018-06-03 | Disposition: A | Discharge status: Home / Self Care | Payer: Managed Care, Other (non HMO) | Source: Ambulatory Visit | Attending: Unknown Physician Specialty | Admitting: Unknown Physician Specialty

## 2018-06-03 DIAGNOSIS — Z0000 Encounter for general adult medical examination without abnormal findings: Secondary | ICD-10-CM | POA: Diagnosis not present

## 2018-06-03 DIAGNOSIS — N6489 Other specified disorders of breast: Secondary | ICD-10-CM

## 2018-06-03 DIAGNOSIS — R928 Other abnormal and inconclusive findings on diagnostic imaging of breast: Secondary | ICD-10-CM

## 2018-06-12 ENCOUNTER — Ambulatory Visit
Admission: RE | Admit: 2018-06-12 | Discharge: 2018-06-12 | Disposition: A | Discharge status: Home / Self Care | Payer: Managed Care, Other (non HMO) | Source: Ambulatory Visit | Attending: Unknown Physician Specialty | Admitting: Unknown Physician Specialty

## 2018-06-12 DIAGNOSIS — N6489 Other specified disorders of breast: Secondary | ICD-10-CM | POA: Diagnosis present

## 2018-06-12 DIAGNOSIS — R928 Other abnormal and inconclusive findings on diagnostic imaging of breast: Secondary | ICD-10-CM

## 2018-06-18 ENCOUNTER — Encounter: Payer: Self-pay | Admitting: Physician Assistant

## 2018-06-18 ENCOUNTER — Other Ambulatory Visit: Payer: Self-pay

## 2018-06-18 ENCOUNTER — Ambulatory Visit: Payer: Managed Care, Other (non HMO) | Admitting: Physician Assistant

## 2018-06-18 ENCOUNTER — Ambulatory Visit: Payer: Managed Care, Other (non HMO) | Admitting: Unknown Physician Specialty

## 2018-06-18 VITALS — BP 125/79 | HR 85 | Temp 98.4°F | Ht 63.1 in | Wt 189.5 lb

## 2018-06-18 DIAGNOSIS — F329 Major depressive disorder, single episode, unspecified: Secondary | ICD-10-CM

## 2018-06-18 DIAGNOSIS — J45909 Unspecified asthma, uncomplicated: Secondary | ICD-10-CM

## 2018-06-18 DIAGNOSIS — F32A Depression, unspecified: Secondary | ICD-10-CM

## 2018-06-18 DIAGNOSIS — G43909 Migraine, unspecified, not intractable, without status migrainosus: Secondary | ICD-10-CM | POA: Diagnosis not present

## 2018-06-18 DIAGNOSIS — E039 Hypothyroidism, unspecified: Secondary | ICD-10-CM

## 2018-06-18 DIAGNOSIS — I10 Essential (primary) hypertension: Secondary | ICD-10-CM

## 2018-06-18 DIAGNOSIS — F5101 Primary insomnia: Secondary | ICD-10-CM

## 2018-06-18 NOTE — Progress Notes (Signed)
Subjective:    Patient ID: Laurie Martinez, female    DOB: 05/30/1971, 47 y.o.   MRN: [id number]  Laurie BilisWendy M Cotterman is a 47 y.o. female presenting on 06/18/2018 for Hypertension and Hypothyroidism   HPI   HTN  Stable on 5 mg lisinopril   Migraines  Imitrex nasal spray for migraines PRN 1-3 times per month.   Contraception  OCP has helped migraines, taken continuously.   Hypothyroidism   Stable on 50 mcg.  Asthma   Acts up in winter, takes Advair daily then   Insomnia   Ambien 30 tabs in February, about twice a month. Only uses as rescue treatment for insomnia.   Anxiety  Klonopin 0.5 mg tablets, maybe one or twice a month. More security blanket.   PAP: Declined, has never been sexually active.   Social History   Tobacco Use  . Smoking status: Never Smoker  . Smokeless tobacco: Never Used  Substance Use Topics  . Alcohol use: No    Alcohol/week: 0.0 standard drinks  . Drug use: No    Review of Systems Per HPI unless specifically indicated above     Objective:    BP 125/79   Pulse 85   Temp 98.4 F (36.9 C) (Tympanic)   Ht 5' 3.1" (1.603 m)   Wt 189 lb 8 oz (86 kg)   SpO2 97%   BMI 33.46 kg/m   Wt Readings from Last 3 Encounters:  06/18/18 189 lb 8 oz (86 kg)  12/19/17 183 lb (83 kg)  08/15/17 184 lb 3.2 oz (83.6 kg)    Physical Exam  Constitutional: She is oriented to person, place, and time. She appears well-developed and well-nourished.  Cardiovascular: Normal rate and regular rhythm.  Pulmonary/Chest: Effort normal and breath sounds normal.  Neurological: She is alert and oriented to person, place, and time.  Skin: Skin is warm and dry.  Psychiatric: She has a normal mood and affect. Her behavior is normal.   Results for orders placed or performed in visit on 12/19/17  TSH  Result Value Ref Range   TSH 1.280 0.450 - 4.500 uIU/mL  Lipid Panel w/o Chol/HDL Ratio  Result Value Ref Range   Cholesterol, Total 179 100 - 199 mg/dL   Triglycerides 409137 0 - 149 mg/dL   HDL 51 >81>39 mg/dL   VLDL Cholesterol Cal 27 5 - 40 mg/dL   LDL Calculated 191101 (H) 0 - 99 mg/dL  Comprehensive metabolic panel  Result Value Ref Range   Glucose 93 65 - 99 mg/dL   BUN 8 6 - 24 mg/dL   Creatinine, Ser 4.780.80 0.57 - 1.00 mg/dL   GFR calc non Af Amer 89 >59 mL/min/1.73   GFR calc Af Amer 102 >59 mL/min/1.73   BUN/Creatinine Ratio 10 9 - 23   Sodium 138 134 - 144 mmol/L   Potassium 4.2 3.5 - 5.2 mmol/L   Chloride 102 96 - 106 mmol/L   CO2 19 (L) 20 - 29 mmol/L   Calcium 9.6 8.7 - 10.2 mg/dL   Total Protein 7.5 6.0 - 8.5 g/dL   Albumin 4.3 3.5 - 5.5 g/dL   Globulin, Total 3.2 1.5 - 4.5 g/dL   Albumin/Globulin Ratio 1.3 1.2 - 2.2   Bilirubin Total 0.3 0.0 - 1.2 mg/dL   Alkaline Phosphatase 62 39 - 117 IU/L   AST 15 0 - 40 IU/L   ALT 16 0 - 32 IU/L  CBC with Differential/Platelet  Result Value Ref Range  WBC 7.1 3.4 - 10.8 x10E3/uL   RBC 4.34 3.77 - 5.28 x10E6/uL   Hemoglobin 14.1 11.1 - 15.9 g/dL   Hematocrit 65.740.0 84.634.0 - 46.6 %   MCV 92 79 - 97 fL   MCH 32.5 26.6 - 33.0 pg   MCHC 35.3 31.5 - 35.7 g/dL   RDW 96.212.5 95.212.3 - 84.115.4 %   Platelets 245 150 - 379 x10E3/uL   Neutrophils 63 Not Estab. %   Lymphs 35 Not Estab. %   Monocytes 1 Not Estab. %   Eos 1 Not Estab. %   Basos 0 Not Estab. %   Neutrophils Absolute 4.4 1.4 - 7.0 x10E3/uL   Lymphocytes Absolute 2.5 0.7 - 3.1 x10E3/uL   Monocytes Absolute 0.1 0.1 - 0.9 x10E3/uL   EOS (ABSOLUTE) 0.1 0.0 - 0.4 x10E3/uL   Basophils Absolute 0.0 0.0 - 0.2 x10E3/uL   Immature Granulocytes 0 Not Estab. %   Immature Grans (Abs) 0.0 0.0 - 0.1 x10E3/uL  Hgb A1c w/o eAG  Result Value Ref Range   Hgb A1c MFr Bld 5.3 4.8 - 5.6 %      Assessment & Plan:  1. Essential hypertension  Stable continue 5 mg lisinopril. 2/19 labs reviewed and were normal.  2. Migraine without status migrainosus, not intractable, unspecified migraine type  Continue imitrex.  3. Persistent asthma without  complication, unspecified asthma severity  Takes advair daily in fall winter. Albuterol PRN.   4. Hypothyroidism, unspecified type  Stable, continue synthroid 50 mcg.  5. Depression, unspecified depression type  Continue medications.  6. Primary insomnia  PRN ambien, 1-2 times per month.     Follow up plan: Return in about 6 months (around 12/19/2018) for Chronic and annual physical .  Osvaldo AngstAdriana Pollak, PA-C [facility]  [facility] 06/18/2018, 4:47 PM

## 2018-12-23 ENCOUNTER — Other Ambulatory Visit: Payer: Self-pay | Admitting: Unknown Physician Specialty

## 2018-12-23 NOTE — Telephone Encounter (Signed)
Requested medication (s) are due for refill today: Yes  Requested medication (s) are on the active medication list: Yes  Last refill:  12/19/17  Future visit scheduled: Yes  Notes to clinic:  Expired Rx, unable to refill.     Requested Prescriptions  Pending Prescriptions Disp Refills   montelukast (SINGULAIR) 10 MG tablet [Pharmacy Med Name: MONTELUKAST SODIUM 10 MG TAB] 90 tablet 3    Sig: TAKE 1 TABLET BY MOUTH ONCE DAILY.     Pulmonology:  Leukotriene Inhibitors Passed - 12/23/2018  4:27 PM      Passed - Valid encounter within last 12 months    Recent Outpatient Visits          6 months ago Essential hypertension   [facility] Trey Sailors, PA-C   1 year ago Essential hypertension   [facility] Gabriel Cirri, NP   1 year ago Primary insomnia   [facility] Gabriel Cirri, NP   1 year ago Essential hypertension   [facility] Gabriel Cirri, NP   1 year ago Essential hypertension   [facility] Gabriel Cirri, NP      Future Appointments            In 2 days Cannady, Dorie Rank, NP [facility]          lisinopril (PRINIVIL,ZESTRIL) 5 MG tablet [Pharmacy Med Name: LISINOPRIL 5 MG TAB] 90 tablet 3    Sig: TAKE 1 TABLET BY MOUTH ONCE DAILY.     Cardiovascular:  ACE Inhibitors Failed - 12/23/2018  4:27 PM      Failed - Cr in normal range and within 180 days    Creatinine, Ser  Date Value Ref Range Status  12/19/2017 0.80 0.57 - 1.00 mg/dL Final         Failed - K in normal range and within 180 days    Potassium  Date Value Ref Range Status  12/19/2017 4.2 3.5 - 5.2 mmol/L Final         Failed - Valid encounter within last 6 months    Recent Outpatient Visits          6 months ago Essential hypertension   [facility] Trey Sailors, PA-C   1 year ago Essential hypertension   [facility] Gabriel Cirri, NP   1 year ago Primary insomnia   [facility] Gabriel Cirri, NP   1 year ago Essential hypertension   [facility] Gabriel Cirri, NP   1 year ago Essential hypertension   [facility] Gabriel Cirri, NP      Future Appointments            In 2 days Marjie Skiff, NP [facility], [facility]           Passed - Patient is not pregnant      Passed - Last BP in normal range    BP Readings from Last 1 Encounters:  06/18/18 125/79

## 2018-12-25 ENCOUNTER — Other Ambulatory Visit: Payer: Self-pay

## 2018-12-25 ENCOUNTER — Encounter: Payer: Self-pay | Admitting: Nurse Practitioner

## 2018-12-25 ENCOUNTER — Ambulatory Visit (INDEPENDENT_AMBULATORY_CARE_PROVIDER_SITE_OTHER): Payer: Managed Care, Other (non HMO) | Admitting: Nurse Practitioner

## 2018-12-25 VITALS — BP 132/83 | HR 89 | Temp 98.2°F | Ht 63.2 in | Wt 195.0 lb

## 2018-12-25 DIAGNOSIS — Z131 Encounter for screening for diabetes mellitus: Secondary | ICD-10-CM | POA: Diagnosis not present

## 2018-12-25 DIAGNOSIS — F329 Major depressive disorder, single episode, unspecified: Secondary | ICD-10-CM

## 2018-12-25 DIAGNOSIS — G43909 Migraine, unspecified, not intractable, without status migrainosus: Secondary | ICD-10-CM

## 2018-12-25 DIAGNOSIS — F32A Depression, unspecified: Secondary | ICD-10-CM

## 2018-12-25 DIAGNOSIS — F419 Anxiety disorder, unspecified: Secondary | ICD-10-CM

## 2018-12-25 DIAGNOSIS — E039 Hypothyroidism, unspecified: Secondary | ICD-10-CM | POA: Diagnosis not present

## 2018-12-25 DIAGNOSIS — I10 Essential (primary) hypertension: Secondary | ICD-10-CM | POA: Diagnosis not present

## 2018-12-25 DIAGNOSIS — F5101 Primary insomnia: Secondary | ICD-10-CM

## 2018-12-25 DIAGNOSIS — E559 Vitamin D deficiency, unspecified: Secondary | ICD-10-CM

## 2018-12-25 DIAGNOSIS — Z0000 Encounter for general adult medical examination without abnormal findings: Secondary | ICD-10-CM

## 2018-12-25 DIAGNOSIS — J45909 Unspecified asthma, uncomplicated: Secondary | ICD-10-CM

## 2018-12-25 MED ORDER — LISINOPRIL 5 MG PO TABS: 5.0000 mg | ORAL_TABLET | Freq: Every day | ORAL | 3 refills | Status: DC

## 2018-12-25 MED ORDER — ZOLPIDEM TARTRATE 10 MG PO TABS: 10.0000 mg | ORAL_TABLET | Freq: Every evening | ORAL | 0 refills | Status: DC | PRN

## 2018-12-25 MED ORDER — ALBUTEROL SULFATE HFA 108 (90 BASE) MCG/ACT IN AERS: 2.0000 | INHALATION_SPRAY | RESPIRATORY_TRACT | 0 refills | Status: DC | PRN

## 2018-12-25 MED ORDER — MONTELUKAST SODIUM 10 MG PO TABS: 10.0000 mg | ORAL_TABLET | Freq: Every day | ORAL | 3 refills | Status: DC

## 2018-12-25 MED ORDER — FLUTICASONE-SALMETEROL 250-50 MCG/DOSE IN AEPB: 1.0000 | INHALATION_SPRAY | Freq: Two times a day (BID) | RESPIRATORY_TRACT | 3 refills | Status: DC

## 2018-12-25 MED ORDER — DESOGESTREL-ETHINYL ESTRADIOL 0.15-30 MG-MCG PO TABS: 1.0000 | ORAL_TABLET | Freq: Every day | ORAL | 3 refills | Status: DC

## 2018-12-25 MED ORDER — LEVOTHYROXINE SODIUM 50 MCG PO TABS: 50.0000 ug | ORAL_TABLET | Freq: Every day | ORAL | 3 refills | Status: DC

## 2018-12-25 MED ORDER — CLONAZEPAM 0.5 MG PO TABS: 0.5000 mg | ORAL_TABLET | Freq: Every day | ORAL | 0 refills | Status: DC | PRN

## 2018-12-25 NOTE — Assessment & Plan Note (Addendum)
Chronic, stable. Pt to continue therapeutic exercises learned in therapy and PRN clonazepam as needed for acute events. Pt educated on risks and side effects associated with use and pt wishes to continue medication at this time. Pt to notify provider of increase in anxious mood or increased need for medication use. Follow-up in appx 6 months or sooner as needs arise.

## 2018-12-25 NOTE — Assessment & Plan Note (Signed)
Chronic. Continue zolpidem as needed for sleep. Pt educated on potential dependence and serious side effects associated with zolpidem use. Educated on importance of taking medication only when a full night sleep can be obtained and not to drive while taking medication. Pt to notify provider of any changes to sleep pattern or increased requirements of medication. Follow-up in 6 months or sooner if needs arise.

## 2018-12-25 NOTE — Assessment & Plan Note (Signed)
Chronic, stable. Continue desogestrel-ethinyl estradiol continuous for hormonal maintenance. Continue use of sumatriptan as prescribed PRN for migraine headaches. Discussed the option of continuing continuous birth control up to age 48 for migraine prevention or allowing a one week break every month for menstrual cycle. Pt opted for continual birth control use at this time. Educated on the importance of annual mammograms with this option. Pt to notify provider with any changes in headaches, frequency, or severity.  

## 2018-12-25 NOTE — Assessment & Plan Note (Signed)
Chronic, stable. Pt to continue with therapeutic exercises learned in therapy. Pt to notify provider of increased signs of depression or depressed mood. Follow-up in appx 6 months or sooner as needs arise.

## 2018-12-25 NOTE — Assessment & Plan Note (Signed)
Chronic, stable. Pt to continue current medication regimen as prescribed. Pt to notify provider with change in symptoms or increased use/need of rescue inhaler. Follow-up in appx 6 months or sooner if needs arise.  

## 2018-12-25 NOTE — Patient Instructions (Signed)
DASH Eating Plan  DASH stands for "Dietary Approaches to Stop Hypertension." The DASH eating plan is a healthy eating plan that has been shown to reduce high blood pressure (hypertension). It may also reduce your risk for type 2 diabetes, heart disease, and stroke. The DASH eating plan may also help with weight loss.  What are tips for following this plan?    General guidelines   Avoid eating more than 2,300 mg (milligrams) of salt (sodium) a day. If you have hypertension, you may need to reduce your sodium intake to 1,500 mg a day.   Limit alcohol intake to no more than 1 drink a day for nonpregnant women and 2 drinks a day for men. One drink equals 12 oz of beer, 5 oz of wine, or 1 oz of hard liquor.   Work with your health care provider to maintain a healthy body weight or to lose weight. Ask what an ideal weight is for you.   Get at least 30 minutes of exercise that causes your heart to beat faster (aerobic exercise) most days of the week. Activities may include walking, swimming, or biking.   Work with your health care provider or diet and nutrition specialist (dietitian) to adjust your eating plan to your individual calorie needs.  Reading food labels     Check food labels for the amount of sodium per serving. Choose foods with less than 5 percent of the Daily Value of sodium. Generally, foods with less than 300 mg of sodium per serving fit into this eating plan.   To find whole grains, look for the word "whole" as the first word in the ingredient list.  Shopping   Buy products labeled as "low-sodium" or "no salt added."   Buy fresh foods. Avoid canned foods and premade or frozen meals.  Cooking   Avoid adding salt when cooking. Use salt-free seasonings or herbs instead of table salt or sea salt. Check with your health care provider or pharmacist before using salt substitutes.   Do not fry foods. Cook foods using healthy methods such as baking, boiling, grilling, and broiling instead.   Cook with  heart-healthy oils, such as olive, canola, soybean, or sunflower oil.  Meal planning   Eat a balanced diet that includes:  ? 5 or more servings of fruits and vegetables each day. At each meal, try to fill half of your plate with fruits and vegetables.  ? Up to 6-8 servings of whole grains each day.  ? Less than 6 oz of lean meat, poultry, or fish each day. A 3-oz serving of meat is about the same size as a deck of cards. One egg equals 1 oz.  ? 2 servings of low-fat dairy each day.  ? A serving of nuts, seeds, or beans 5 times each week.  ? Heart-healthy fats. Healthy fats called Omega-3 fatty acids are found in foods such as flaxseeds and coldwater fish, like sardines, salmon, and mackerel.   Limit how much you eat of the following:  ? Canned or prepackaged foods.  ? Food that is high in trans fat, such as fried foods.  ? Food that is high in saturated fat, such as fatty meat.  ? Sweets, desserts, sugary drinks, and other foods with added sugar.  ? Full-fat dairy products.   Do not salt foods before eating.   Try to eat at least 2 vegetarian meals each week.   Eat more home-cooked food and less restaurant, buffet, and fast food.     When eating at a restaurant, ask that your food be prepared with less salt or no salt, if possible.  What foods are recommended?  The items listed may not be a complete list. Talk with your dietitian about what dietary choices are best for you.  Grains  Whole-grain or whole-wheat bread. Whole-grain or whole-wheat pasta. Brown rice. Oatmeal. Quinoa. Bulgur. Whole-grain and low-sodium cereals. Pita bread. Low-fat, low-sodium crackers. Whole-wheat flour tortillas.  Vegetables  Fresh or frozen vegetables (raw, steamed, roasted, or grilled). Low-sodium or reduced-sodium tomato and vegetable juice. Low-sodium or reduced-sodium tomato sauce and tomato paste. Low-sodium or reduced-sodium canned vegetables.  Fruits  All fresh, dried, or frozen fruit. Canned fruit in natural juice (without  added sugar).  Meat and other protein foods  Skinless chicken or turkey. Ground chicken or turkey. Pork with fat trimmed off. Fish and seafood. Egg whites. Dried beans, peas, or lentils. Unsalted nuts, nut butters, and seeds. Unsalted canned beans. Lean cuts of beef with fat trimmed off. Low-sodium, lean deli meat.  Dairy  Low-fat (1%) or fat-free (skim) milk. Fat-free, low-fat, or reduced-fat cheeses. Nonfat, low-sodium ricotta or cottage cheese. Low-fat or nonfat yogurt. Low-fat, low-sodium cheese.  Fats and oils  Soft margarine without trans fats. Vegetable oil. Low-fat, reduced-fat, or light mayonnaise and salad dressings (reduced-sodium). Canola, safflower, olive, soybean, and sunflower oils. Avocado.  Seasoning and other foods  Herbs. Spices. Seasoning mixes without salt. Unsalted popcorn and pretzels. Fat-free sweets.  What foods are not recommended?  The items listed may not be a complete list. Talk with your dietitian about what dietary choices are best for you.  Grains  Baked goods made with fat, such as croissants, muffins, or some breads. Dry pasta or rice meal packs.  Vegetables  Creamed or fried vegetables. Vegetables in a cheese sauce. Regular canned vegetables (not low-sodium or reduced-sodium). Regular canned tomato sauce and paste (not low-sodium or reduced-sodium). Regular tomato and vegetable juice (not low-sodium or reduced-sodium). Pickles. Olives.  Fruits  Canned fruit in a light or heavy syrup. Fried fruit. Fruit in cream or butter sauce.  Meat and other protein foods  Fatty cuts of meat. Ribs. Fried meat. Bacon. Sausage. Bologna and other processed lunch meats. Salami. Fatback. Hotdogs. Bratwurst. Salted nuts and seeds. Canned beans with added salt. Canned or smoked fish. Whole eggs or egg yolks. Chicken or turkey with skin.  Dairy  Whole or 2% milk, cream, and half-and-half. Whole or full-fat cream cheese. Whole-fat or sweetened yogurt. Full-fat cheese. Nondairy creamers. Whipped toppings.  Processed cheese and cheese spreads.  Fats and oils  Butter. Stick margarine. Lard. Shortening. Ghee. Bacon fat. Tropical oils, such as coconut, palm kernel, or palm oil.  Seasoning and other foods  Salted popcorn and pretzels. Onion salt, garlic salt, seasoned salt, table salt, and sea salt. Worcestershire sauce. Tartar sauce. Barbecue sauce. Teriyaki sauce. Soy sauce, including reduced-sodium. Steak sauce. Canned and packaged gravies. Fish sauce. Oyster sauce. Cocktail sauce. Horseradish that you find on the shelf. Ketchup. Mustard. Meat flavorings and tenderizers. Bouillon cubes. Hot sauce and Tabasco sauce. Premade or packaged marinades. Premade or packaged taco seasonings. Relishes. Regular salad dressings.  Where to find more information:   National Heart, Lung, and Blood Institute: www.nhlbi.nih.gov   American Heart Association: www.heart.org  Summary   The DASH eating plan is a healthy eating plan that has been shown to reduce high blood pressure (hypertension). It may also reduce your risk for type 2 diabetes, heart disease, and stroke.   With the   DASH eating plan, you should limit salt (sodium) intake to 2,300 mg a day. If you have hypertension, you may need to reduce your sodium intake to 1,500 mg a day.   When on the DASH eating plan, aim to eat more fresh fruits and vegetables, whole grains, lean proteins, low-fat dairy, and heart-healthy fats.   Work with your health care provider or diet and nutrition specialist (dietitian) to adjust your eating plan to your individual calorie needs.  This information is not intended to replace advice given to you by your health care provider. Make sure you discuss any questions you have with your health care provider.  Document Released: 10/12/2011 Document Revised: 10/16/2016 Document Reviewed: 10/16/2016  Elsevier Interactive Patient Education  2019 [employer]

## 2018-12-25 NOTE — Assessment & Plan Note (Signed)
Chronic, stable. Continue lisinopril daily as prescribed. Follow-up in 6 months or sooner if need arises.

## 2018-12-25 NOTE — Progress Notes (Signed)
BP 132/83   Pulse 89   Temp 98.2 F (36.8 C) (Oral)   Ht 5' 3.2" (1.605 m)   Wt 195 lb (88.5 kg)   SpO2 97%   BMI 34.32 kg/m    Subjective:    Patient ID: Laurie Martinez, female    DOB: [birth year], 48 y.o.   MRN: [id number]  HPI: Laurie Martinez is a 48 y.o. female presenting on 12/25/2018 for comprehensive medical examination. Current medical complaints include:none  She currently lives with: sister Menopausal Symptoms: no - pt is on continuous birth control pills for hormonal migraine symptoms and does not have menstrual cycles.   HYPERTENSION Continues lisinopril 5mg  daily.  Hypertension status: controlled  Satisfied with current treatment? yes Duration of hypertension: chronic BP monitoring frequency:  weekly BP range: 120's-130's/80's BP medication side effects:  no Medication compliance: good compliance Aspirin: no Recurrent headaches: history of chronic migraine headaches, but nothing attributed to elevated blood pressure Visual changes: no Palpitations: no Dyspnea: no Chest pain: no Lower extremity edema: no Dizzy/lightheaded: no   HYPOTHYROIDISM Last TSH was 1.2810 February 2019.  Continues on Levothyroxine. Thyroid control status:controlled Satisfied with current treatment? yes Medication side effects: no Medication compliance: good compliance Recent dose adjustment:no Fatigue: no Cold intolerance: no Heat intolerance: no Weight gain: yes Weight loss: no Constipation: yes- chronic issue- no change in baseline Diarrhea/loose stools: no Palpitations: no Lower extremity edema: no Anxiety/depressed mood: yes - chronic issue- no change in baseline  ASTHMA Continues Singulair and Advair daily and Ventolin PRN.  Asthma status: controlled Satisfied with current treatment?: yes Albuterol/rescue inhaler frequency: 2-3 times per month 2-3 months per year- used primarily in the spring and the fall during increased allergies  Dyspnea frequency: 2-3 times per  month 2-3 months per year Wheezing frequency: 2-3 times per month 2-3 months per year Cough frequency: 2-3 times per month 2-3 times per year Nocturnal symptom frequency: none Limitation of activity: yes - using the rescue inhaler relieves symptoms Current upper respiratory symptoms: no Triggers: seasonal allergies in the spring and fall  Visits to ER or Urgent Care in past year: no Pneumovax: Up to Date Influenza: Not up to Date - patient declines the flu vaccine  DEPRESSION, ANXIETY, & INSOMNIA Continues Ambien and Klonopin. Pt reports satisfaction with current treatment options and wishes to continue. Pt is aware of risks of benzo medication use to include increased sedation, respiratory suppression, falls, extrapyramidal movements,  dependence and cardiovascular events.  Pt would like to continue treatment as benefit determined to outweigh risk.   Pt reports use of therapeutic exercises learned in therapy to decrease depression and anxiety symptoms. She reports depression symptoms are worse in the winter, but feels these symptoms are well controlled with this method. She states that she has taken daily medication in the past, but does not like the way they make her feel and would prefer to manager her symptoms in a natural manner as much as possible. Pt reports klonopin use less than once a month as needed for severe symptoms. Pt reports difficulty sleeping most nights, but reserves use of ambien for weekends when she is able to get a full nights rest and has limited responsibilities the following day. She reports use approximately every other week for sleep.   Mood status: controlled Satisfied with current treatment?: yes Symptom severity: moderate  Duration of current treatment : chronic Side effects: no Medication compliance: good compliance Psychotherapy/counseling: yes in the past Previous psychiatric medications: pt reports "several"  medications > 5 years ago, but cannot recall the  names Depressed mood: yes Anxious mood:yes Anhedonia: no Significant weight loss or gain: no Insomnia: yes Fatigue: yes Feelings of worthlessness or guilt: no Impaired concentration/indecisiveness: ocassional Suicidal ideations: no Hopelessness: no Crying spells: no   Depression screen [facility] 2/9 12/25/2018 06/18/2018 12/19/2017 08/15/2017 12/13/2016  Decreased Interest 0 0 0 1 2  Down, Depressed, Hopeless 0 0 0 1 1  PHQ - 2 Score 0 0 0 2 3  Altered sleeping 2 2 3 3 3   Tired, decreased energy 2 2 1 2 2   Change in appetite 0 1 0 1 0  Feeling bad or failure about yourself  0 0 0 0 0  Trouble concentrating 2 2 1 1 1   Moving slowly or fidgety/restless 0 - 0 0 1  Suicidal thoughts 0 0 0 0 0  PHQ-9 Score 6 7 5 9 10   Difficult doing work/chores Somewhat difficult - - - -   GAD 7 : Generalized Anxiety Score 12/25/2018 06/18/2018  Nervous, Anxious, on Edge 1 2  Control/stop worrying 0 2  Worry too much - different things 0 2  Trouble relaxing 1 -  Restless 1 1  Easily annoyed or irritable 0 1  Afraid - awful might happen 0 1  Total GAD 7 Score 3 -  Anxiety Difficulty Somewhat difficult -    MIGRAINE HEADACHE: Continues Reclipsen daily and Imitrex PRN. Pt reports satisfaction with continued treatment options and wishes to continue.  Pt reports migraine headache symptoms of unilateral headache in the frontal region with 8/10 pain and sensitivity to light and sound occurring approximately every 3 months. Pt reports these are well controlled with continuous birth control use and resolve with Imitrex use. She denies increased frequency or a change in symptoms.    The patient does not have a history of falls. I did not complete a risk assessment for falls. A plan of care for falls was not documented.   Past Medical History:  Past Medical History:  Diagnosis Date  . Asthma   . Constipation   . Depression   . Hypertension   . Insomnia     Surgical History:  History reviewed. No  pertinent surgical history.  Medications:  Current Outpatient Medications on File Prior to Visit  Medication Sig  . albuterol (PROVENTIL HFA;VENTOLIN HFA) 108 (90 Base) MCG/ACT inhaler Inhale 2 puffs into the lungs every 4 (four) hours as needed for wheezing or shortness of breath.  . clonazePAM (KLONOPIN) 0.5 MG tablet Take 1 tablet (0.5 mg total) by mouth daily as needed for anxiety.  Marland Kitchen desogestrel-ethinyl estradiol (RECLIPSEN) 0.15-30 MG-MCG tablet Take 1 tablet by mouth daily.  . Fluticasone-Salmeterol (ADVAIR DISKUS) 250-50 MCG/DOSE AEPB Inhale 1 puff into the lungs 2 (two) times daily.  . hydrocortisone (ANUSOL-HC) 2.5 % rectal cream Place 1 application rectally 2 (two) times daily.  Marland Kitchen levothyroxine (SYNTHROID, LEVOTHROID) 50 MCG tablet Take 1 tablet (50 mcg total) by mouth daily before breakfast.  . lisinopril (PRINIVIL,ZESTRIL) 5 MG tablet TAKE 1 TABLET BY MOUTH ONCE DAILY.  . montelukast (SINGULAIR) 10 MG tablet TAKE 1 TABLET BY MOUTH ONCE DAILY.  Marland Kitchen zolpidem (AMBIEN) 10 MG tablet Take 1 tablet (10 mg total) by mouth at bedtime as needed for sleep.  . SUMAtriptan (IMITREX) 20 MG/ACT nasal spray Place 1 spray (20 mg total) into the nose once for 1 dose. May repeat in 2 hours if headache persists or recurs.   No current facility-administered medications on  file prior to visit.     Allergies:  No Known Allergies  Social History:  Social History   Socioeconomic History  . Marital status: Single    Spouse name: Not on file  . Number of children: Not on file  . Years of education: Not on file  . Highest education level: Not on file  Occupational History  . Not on file  Social Needs  . Financial resource strain: Not on file  . Food insecurity:    Worry: Not on file    Inability: Not on file  . Transportation needs:    Medical: Not on file    Non-medical: Not on file  Tobacco Use  . Smoking status: Never Smoker  . Smokeless tobacco: Never Used  Substance and Sexual  Activity  . Alcohol use: No    Alcohol/week: 0.0 standard drinks  . Drug use: No  . Sexual activity: Never  Lifestyle  . Physical activity:    Days per week: Not on file    Minutes per session: Not on file  . Stress: Not on file  Relationships  . Social connections:    Talks on phone: Not on file    Gets together: Not on file    Attends religious service: Not on file    Active member of club or organization: Not on file    Attends meetings of clubs or organizations: Not on file    Relationship status: Not on file  . Intimate partner violence:    Fear of current or ex partner: Not on file    Emotionally abused: Not on file    Physically abused: Not on file    Forced sexual activity: Not on file  Other Topics Concern  . Not on file  Social History Narrative  . Not on file   Social History   Tobacco Use  Smoking Status Never Smoker  Smokeless Tobacco Never Used   Social History   Substance and Sexual Activity  Alcohol Use No  . Alcohol/week: 0.0 standard drinks    Family History:  Family History  Problem Relation Age of Onset  . Depression Mother   . Migraines Sister   . Breast cancer Maternal Grandmother     Past medical history, surgical history, medications, allergies, family history and social history reviewed with patient today and changes made to appropriate areas of the chart.   Review of Systems - Negative except as listed in HPI All other ROS negative except what is listed above and in the HPI.      Objective:    BP 132/83   Pulse 89   Temp 98.2 F (36.8 C) (Oral)   Ht 5' 3.2" (1.605 m)   Wt 195 lb (88.5 kg)   SpO2 97%   BMI 34.32 kg/m   Wt Readings from Last 3 Encounters:  12/25/18 195 lb (88.5 kg)  06/18/18 189 lb 8 oz (86 kg)  12/19/17 183 lb (83 kg)    Physical Exam Vitals signs and nursing note reviewed.  Constitutional:      Appearance: She is well-developed.  HENT:     Head: Normocephalic and atraumatic.     Right Ear: Hearing,  tympanic membrane, ear canal and external ear normal.     Left Ear: Hearing, tympanic membrane, ear canal and external ear normal.     Nose: Nose normal.     Right Sinus: No maxillary sinus tenderness or frontal sinus tenderness.     Left Sinus: No maxillary  sinus tenderness or frontal sinus tenderness.  Eyes:     General:        Right eye: No discharge.        Left eye: No discharge.     Conjunctiva/sclera: Conjunctivae normal.     Pupils: Pupils are equal, round, and reactive to light.  Neck:     Musculoskeletal: Normal range of motion and neck supple.     Thyroid: No thyromegaly.     Vascular: No carotid bruit or JVD.  Cardiovascular:     Rate and Rhythm: Normal rate and regular rhythm.     Heart sounds: Normal heart sounds.  Pulmonary:     Effort: Pulmonary effort is normal.     Breath sounds: Normal breath sounds.  Chest:     Chest wall: No mass, deformity, swelling or tenderness.     Breasts:        Right: Normal. No nipple discharge or skin change.        Left: Normal. No nipple discharge or skin change.  Abdominal:     General: Bowel sounds are normal.     Palpations: Abdomen is soft. There is no hepatomegaly or splenomegaly.  Musculoskeletal: Normal range of motion.  Lymphadenopathy:     Cervical: No cervical adenopathy.     Upper Body:     Right upper body: No supraclavicular or axillary adenopathy.     Left upper body: No axillary adenopathy.  Skin:    General: Skin is warm and dry.  Neurological:     Mental Status: She is alert and oriented to person, place, and time.     Deep Tendon Reflexes: Reflexes are normal and symmetric.     Reflex Scores:      Brachioradialis reflexes are 2+ on the right side and 2+ on the left side.      Patellar reflexes are 2+ on the right side and 2+ on the left side. Psychiatric:        Behavior: Behavior normal.     Results for orders placed or performed in visit on 12/19/17  TSH  Result Value Ref Range   TSH 1.280 0.450 -  4.500 uIU/mL  Lipid Panel w/o Chol/HDL Ratio  Result Value Ref Range   Cholesterol, Total 179 100 - 199 mg/dL   Triglycerides 409 0 - 149 mg/dL   HDL 51 >81 mg/dL   VLDL Cholesterol Cal 27 5 - 40 mg/dL   LDL Calculated 191 (H) 0 - 99 mg/dL  Comprehensive metabolic panel  Result Value Ref Range   Glucose 93 65 - 99 mg/dL   BUN 8 6 - 24 mg/dL   Creatinine, Ser 4.78 0.57 - 1.00 mg/dL   GFR calc non Af Amer 89 >59 mL/min/1.73   GFR calc Af Amer 102 >59 mL/min/1.73   BUN/Creatinine Ratio 10 9 - 23   Sodium 138 134 - 144 mmol/L   Potassium 4.2 3.5 - 5.2 mmol/L   Chloride 102 96 - 106 mmol/L   CO2 19 (L) 20 - 29 mmol/L   Calcium 9.6 8.7 - 10.2 mg/dL   Total Protein 7.5 6.0 - 8.5 g/dL   Albumin 4.3 3.5 - 5.5 g/dL   Globulin, Total 3.2 1.5 - 4.5 g/dL   Albumin/Globulin Ratio 1.3 1.2 - 2.2   Bilirubin Total 0.3 0.0 - 1.2 mg/dL   Alkaline Phosphatase 62 39 - 117 IU/L   AST 15 0 - 40 IU/L   ALT 16 0 - 32 IU/L  CBC with  Differential/Platelet  Result Value Ref Range   WBC 7.1 3.4 - 10.8 x10E3/uL   RBC 4.34 3.77 - 5.28 x10E6/uL   Hemoglobin 14.1 11.1 - 15.9 g/dL   Hematocrit 40.9 81.1 - 46.6 %   MCV 92 79 - 97 fL   MCH 32.5 26.6 - 33.0 pg   MCHC 35.3 31.5 - 35.7 g/dL   RDW 91.4 78.2 - 95.6 %   Platelets 245 150 - 379 x10E3/uL   Neutrophils 63 Not Estab. %   Lymphs 35 Not Estab. %   Monocytes 1 Not Estab. %   Eos 1 Not Estab. %   Basos 0 Not Estab. %   Neutrophils Absolute 4.4 1.4 - 7.0 x10E3/uL   Lymphocytes Absolute 2.5 0.7 - 3.1 x10E3/uL   Monocytes Absolute 0.1 0.1 - 0.9 x10E3/uL   EOS (ABSOLUTE) 0.1 0.0 - 0.4 x10E3/uL   Basophils Absolute 0.0 0.0 - 0.2 x10E3/uL   Immature Granulocytes 0 Not Estab. %   Immature Grans (Abs) 0.0 0.0 - 0.1 x10E3/uL  Hgb A1c w/o eAG  Result Value Ref Range   Hgb A1c MFr Bld 5.3 4.8 - 5.6 %      Assessment & Plan:   Problem List Items Addressed This Visit      Cardiovascular and Mediastinum   Hypertension    Chronic, stable. Continue  lisinopril daily as prescribed. Follow-up in 6 months or sooner if need arises.       Migraines    Chronic, stable. Continue desogestrel-ethinyl estradiol continuous for hormonal maintenance. Continue use of sumatriptan as prescribed PRN for migraine headaches. Discussed the option of continuing continuous birth control up to age 8 for migraine prevention or allowing a one week break every month for menstrual cycle. Pt opted for continual birth control use at this time. Educated on the importance of annual mammograms with this option. Pt to notify provider with any changes in headaches, frequency, or severity.         Respiratory   Asthma    Chronic,stable. Pt to continue current medication regimen as prescribed. Pt to notify provider with change in symptoms or increased use/need of rescue inhaler. Follow-up in appx 6 months or sooner if needs arise.         Endocrine   Hypothyroidism    Chronic, stable. Labs drawn today. Will assess need for changes to medication regimen based on lab results obtained. Pt educated on taking the medication daily, 30 minutes prior to food or other medications. Follow-up in appx 6 months or sooner if needs arise.         Other   Insomnia    Chronic. Continue zolpidem as needed for sleep. Pt educated on potential dependence and serious side effects associated with zolpidem use. Educated on importance of taking medication only when a full night sleep can be obtained and not to drive while taking medication. Pt to notify provider of any changes to sleep pattern or increased requirements of medication. Follow-up in 6 months or sooner if needs arise.        Depression    Chronic, stable. Pt to continue with therapeutic exercises learned in therapy. Pt to notify provider of increased signs of depression or depressed mood. Follow-up in appx 6 months or sooner as needs arise.       Acute anxiety    Chronic, stable. Pt to continue therapeutic exercises learned in  therapy and PRN clonazepam as needed for acute events. Pt educated on risks and side effects associated  with use and pt wishes to continue medication at this time. Pt to notify provider of increase in anxious mood or increased need for medication use. Follow-up in appx 6 months or sooner as needs arise.        Other Visit Diagnoses    Annual physical exam    -  Primary   Relevant Orders   CBC with Differential/Platelet   Comprehensive metabolic panel   TSH   Lipid Panel w/o Chol/HDL Ratio   Vitamin D deficiency       Relevant Orders   VITAMIN D 25 Hydroxy (Vit-D Deficiency, Fractures)   Diabetes mellitus screening       Relevant Orders   HgB A1c       Follow up plan: Return in about 6 months (around 06/25/2019) for HTN, Hypothyroid, Anxiety/Depression.   LABORATORY TESTING:  - Pap smear: not applicable- pt declined pap test today  IMMUNIZATIONS:   - Tdap: Tetanus vaccination status reviewed: last tetanus booster within 10 years. - Influenza: Refused - Pneumovax: Up to date - Prevnar: Not applicable - HPV: Not applicable - Zostavax vaccine: Not applicable  SCREENING: -Mammogram: Up to date  - Colonoscopy: Not applicable  - Bone Density: Not applicable  -Hearing Test: Not applicable  -Spirometry: unknown last date performed   PATIENT COUNSELING:   Advised to take 1 mg of folate supplement per day if capable of pregnancy.   Sexuality: Discussed sexually transmitted diseases, partner selection, use of condoms, avoidance of unintended pregnancy  and contraceptive alternatives.   Advised to avoid cigarette smoking.  I discussed with the patient that most people either abstain from alcohol or drink within safe limits (<=14/week and <=4 drinks/occasion for males, <=7/weeks and <= 3 drinks/occasion for females) and that the risk for alcohol disorders and other health effects rises proportionally with the number of drinks per week and how often a drinker exceeds daily  limits.  Discussed cessation/primary prevention of drug use and availability of treatment for abuse.   Diet: Encouraged to adjust caloric intake to maintain  or achieve ideal body weight, to reduce intake of dietary saturated fat and total fat, to limit sodium intake by avoiding high sodium foods and not adding table salt, and to maintain adequate dietary potassium and calcium preferably from fresh fruits, vegetables, and low-fat dairy products.    stressed the importance of regular exercise  Injury prevention: Discussed safety belts, safety helmets, smoke detector, smoking near bedding or upholstery.   Dental health: Discussed importance of regular tooth brushing, flossing, and dental visits.    NEXT PREVENTATIVE PHYSICAL DUE IN 1 YEAR. Return in about 6 months (around 06/25/2019) for HTN, Hypothyroid, Anxiety/Depression.   Controlled substance.  Checked data base and no other refills of controlled substances noted in over a year.  NOTE WRITTEN BY [employer] DNP STUDENT.  ASSESSMENT AND PLAN OF CARE REVIEWED WITH STUDENT, AGREE WITH ABOVE FINDINGS AND PLAN.

## 2018-12-25 NOTE — Assessment & Plan Note (Signed)
Chronic, stable. Labs drawn today. Will assess need for changes to medication regimen based on lab results obtained. Pt educated on taking the medication daily, 30 minutes prior to food or other medications. Follow-up in appx 6 months or sooner if needs arise.

## 2018-12-26 LAB — CBC WITH DIFFERENTIAL/PLATELET
Basophils Absolute: 0.1 10*3/uL (ref 0.0–0.2)
Basos: 1 %
EOS (ABSOLUTE): 0.2 10*3/uL (ref 0.0–0.4)
Eos: 3 %
Hematocrit: 40.4 % (ref 34.0–46.6)
Hemoglobin: 13.8 g/dL (ref 11.1–15.9)
Immature Grans (Abs): 0 10*3/uL (ref 0.0–0.1)
Immature Granulocytes: 0 %
Lymphocytes Absolute: 2.6 10*3/uL (ref 0.7–3.1)
Lymphs: 31 %
MCH: 32.3 pg (ref 26.6–33.0)
MCHC: 34.2 g/dL (ref 31.5–35.7)
MCV: 95 fL (ref 79–97)
Monocytes Absolute: 0.5 10*3/uL (ref 0.1–0.9)
Monocytes: 6 %
Neutrophils Absolute: 5.1 10*3/uL (ref 1.4–7.0)
Neutrophils: 59 %
Platelets: 267 10*3/uL (ref 150–450)
RBC: 4.27 x10E6/uL (ref 3.77–5.28)
RDW: 11.5 % — ABNORMAL LOW (ref 11.7–15.4)
WBC: 8.4 10*3/uL (ref 3.4–10.8)

## 2018-12-26 LAB — COMPREHENSIVE METABOLIC PANEL
ALT: 11 IU/L (ref 0–32)
AST: 11 IU/L (ref 0–40)
Albumin/Globulin Ratio: 1.3 (ref 1.2–2.2)
Albumin: 4.3 g/dL (ref 3.8–4.8)
Alkaline Phosphatase: 65 IU/L (ref 39–117)
BUN/Creatinine Ratio: 14 (ref 9–23)
BUN: 12 mg/dL (ref 6–24)
Bilirubin Total: 0.2 mg/dL (ref 0.0–1.2)
CHLORIDE: 102 mmol/L (ref 96–106)
CO2: 22 mmol/L (ref 20–29)
Calcium: 9.7 mg/dL (ref 8.7–10.2)
Creatinine, Ser: 0.87 mg/dL (ref 0.57–1.00)
GFR calc Af Amer: 92 mL/min/{1.73_m2} (ref >59)
GFR calc non Af Amer: 80 mL/min/{1.73_m2} (ref >59)
Globulin, Total: 3.3 g/dL (ref 1.5–4.5)
Glucose: 76 mg/dL (ref 65–99)
Potassium: 4.5 mmol/L (ref 3.5–5.2)
Sodium: 139 mmol/L (ref 134–144)
Total Protein: 7.6 g/dL (ref 6.0–8.5)

## 2018-12-26 LAB — VITAMIN D 25 HYDROXY (VIT D DEFICIENCY, FRACTURES): Vit D, 25-Hydroxy: 75 ng/mL (ref 30.0–100.0)

## 2018-12-26 LAB — LIPID PANEL W/O CHOL/HDL RATIO
Cholesterol, Total: 215 mg/dL — ABNORMAL HIGH (ref 100–199)
HDL: 65 mg/dL (ref >39)
LDL Calculated: 121 mg/dL — ABNORMAL HIGH (ref 0–99)
Triglycerides: 143 mg/dL (ref 0–149)
VLDL Cholesterol Cal: 29 mg/dL (ref 5–40)

## 2018-12-26 LAB — TSH: TSH: 1.88 u[IU]/mL (ref 0.450–4.500)

## 2018-12-26 LAB — HEMOGLOBIN A1C
Est. average glucose Bld gHb Est-mCnc: 111 mg/dL
Hgb A1c MFr Bld: 5.5 % (ref 4.8–5.6)

## 2019-01-23 ENCOUNTER — Other Ambulatory Visit: Payer: Self-pay | Admitting: Nurse Practitioner

## 2019-01-27 ENCOUNTER — Other Ambulatory Visit: Payer: Self-pay | Admitting: Nurse Practitioner

## 2019-01-27 NOTE — Telephone Encounter (Signed)
Refill on Klonopin at this time due to increased anxiety with COVID.  Was seen in February and provided 30 day supply only.

## 2019-01-27 NOTE — Telephone Encounter (Signed)
Can pt have a refill on this 

## 2019-03-26 ENCOUNTER — Ambulatory Visit: Payer: Managed Care, Other (non HMO) | Admitting: Nurse Practitioner

## 2019-04-24 IMAGING — MG MM DIGITAL DIAGNOSTIC UNILAT*L* W/ TOMO W/ CAD
6 series · 6 of 18 positions shown · non-contrast
Comparison: Previous exams including recent screening mammogram
dated 06/03/2018.

CLINICAL DATA: Patient returns today to evaluate a possible LEFT
breast asymmetry questioned on recent screening mammogram.

EXAM:
DIGITAL DIAGNOSTIC UNILATERAL LEFT MAMMOGRAM WITH CAD AND TOMO

[L MLO synth-2D (1 of 2)]
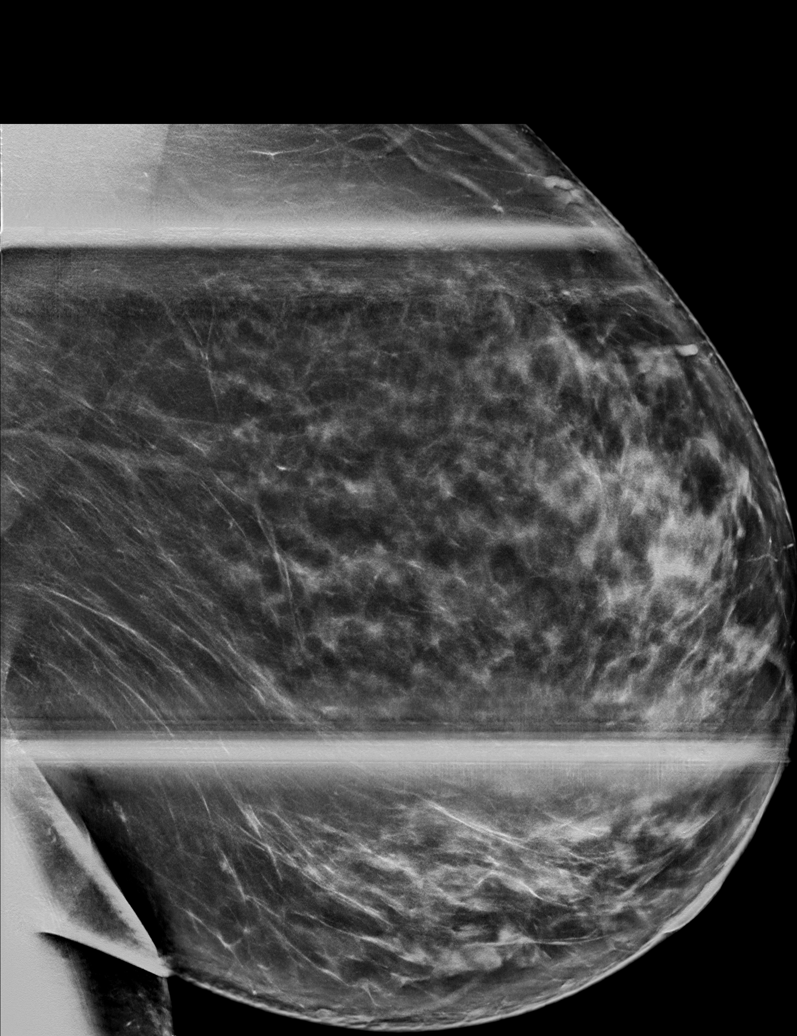

[L MLO synth-2D (2 of 2)]
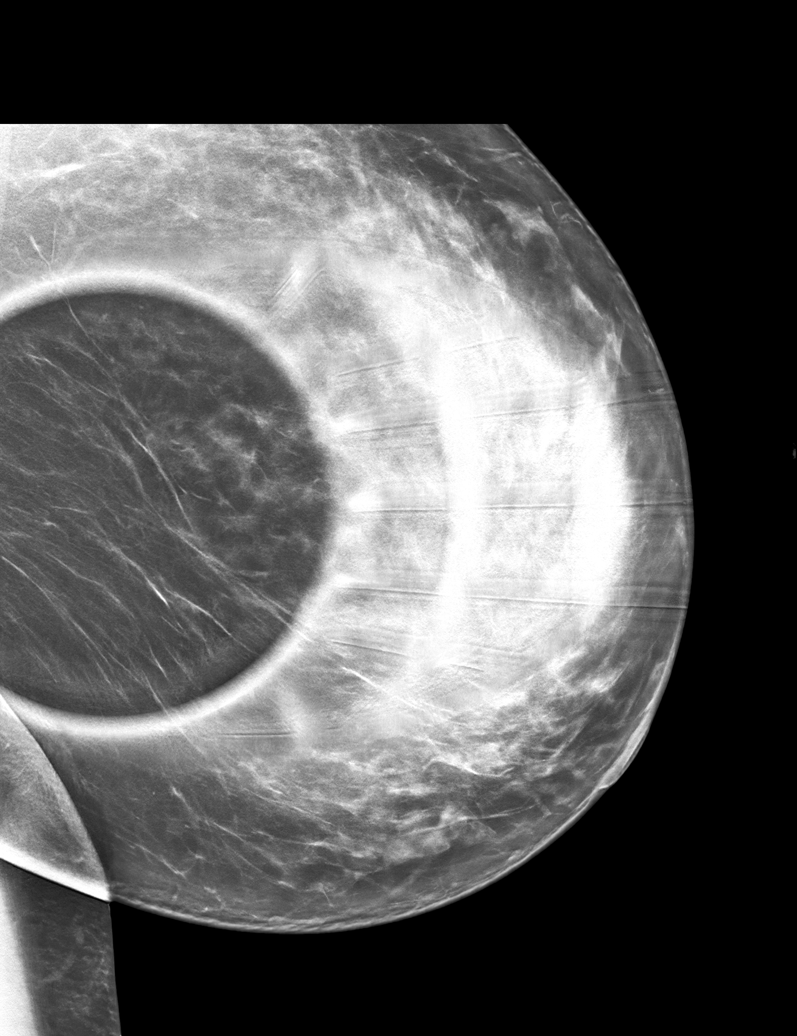

[L ML synth-2D]
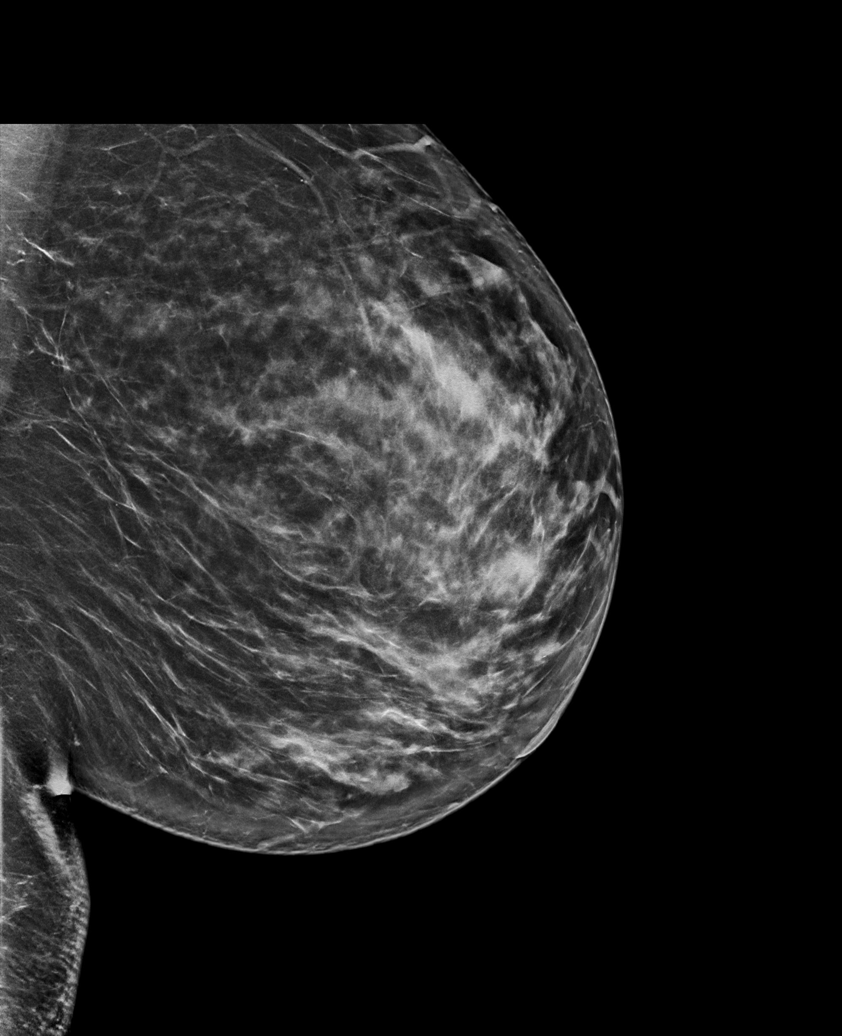

[L MLO tomo (1 of 2) · tomo slice 45/90.0]
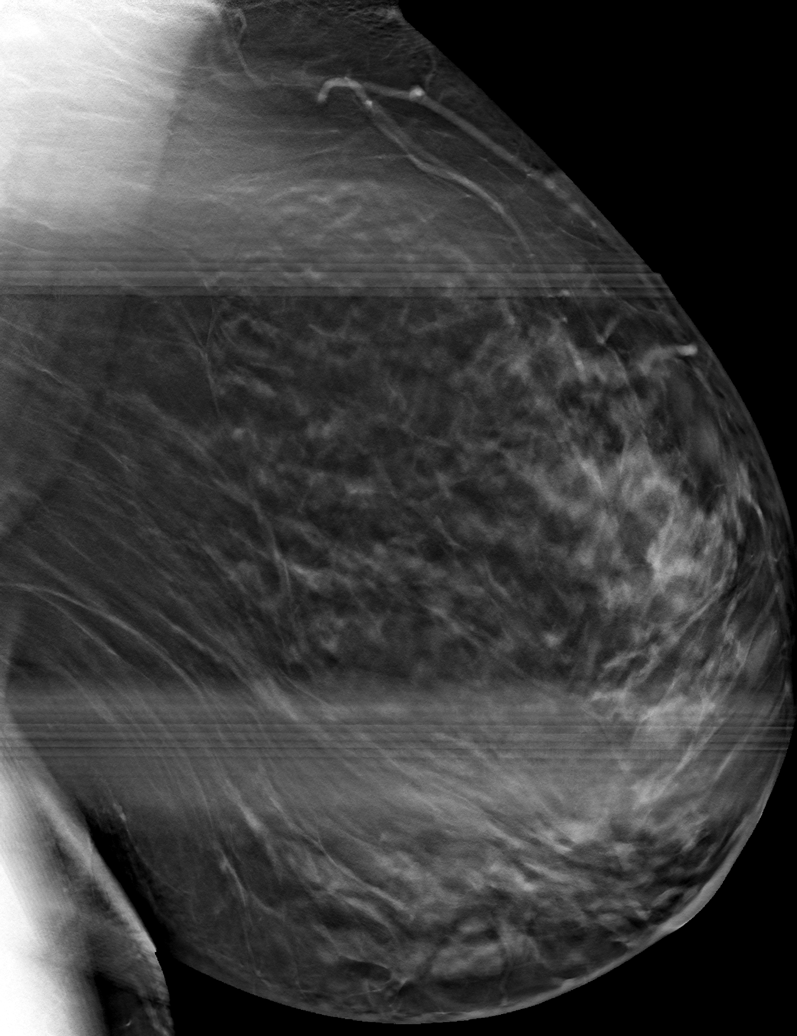

[L MLO tomo (2 of 2) · tomo slice 42/83.0]
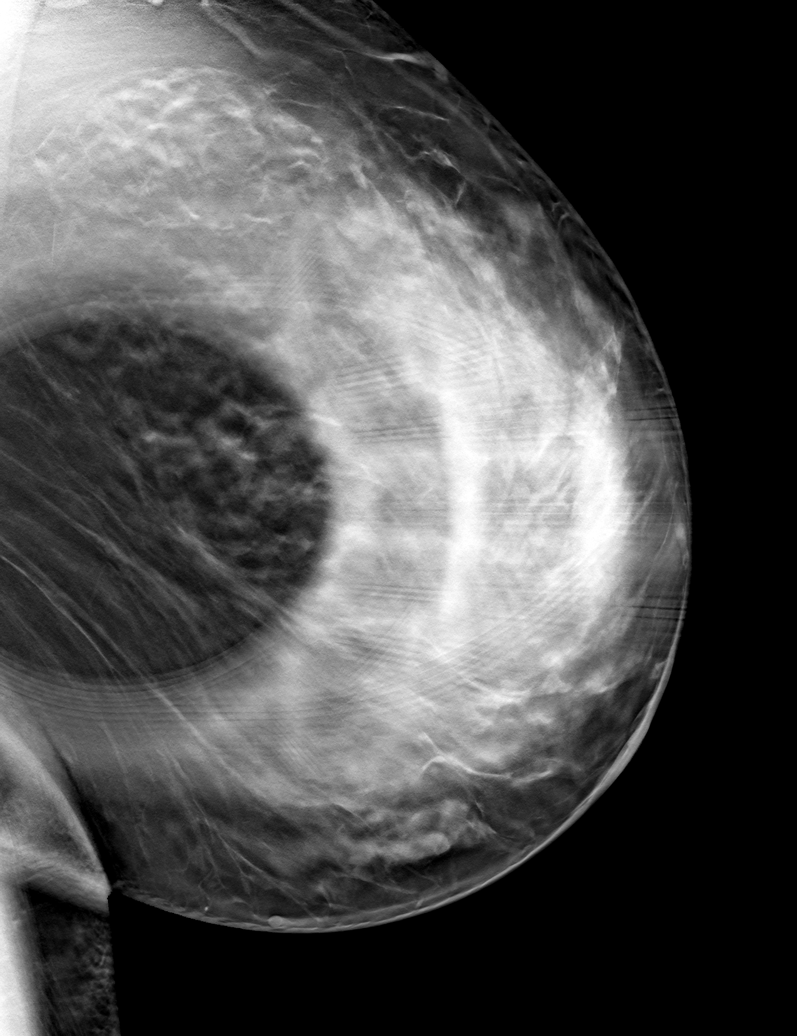

[L ML tomo · tomo slice 45/89.0]
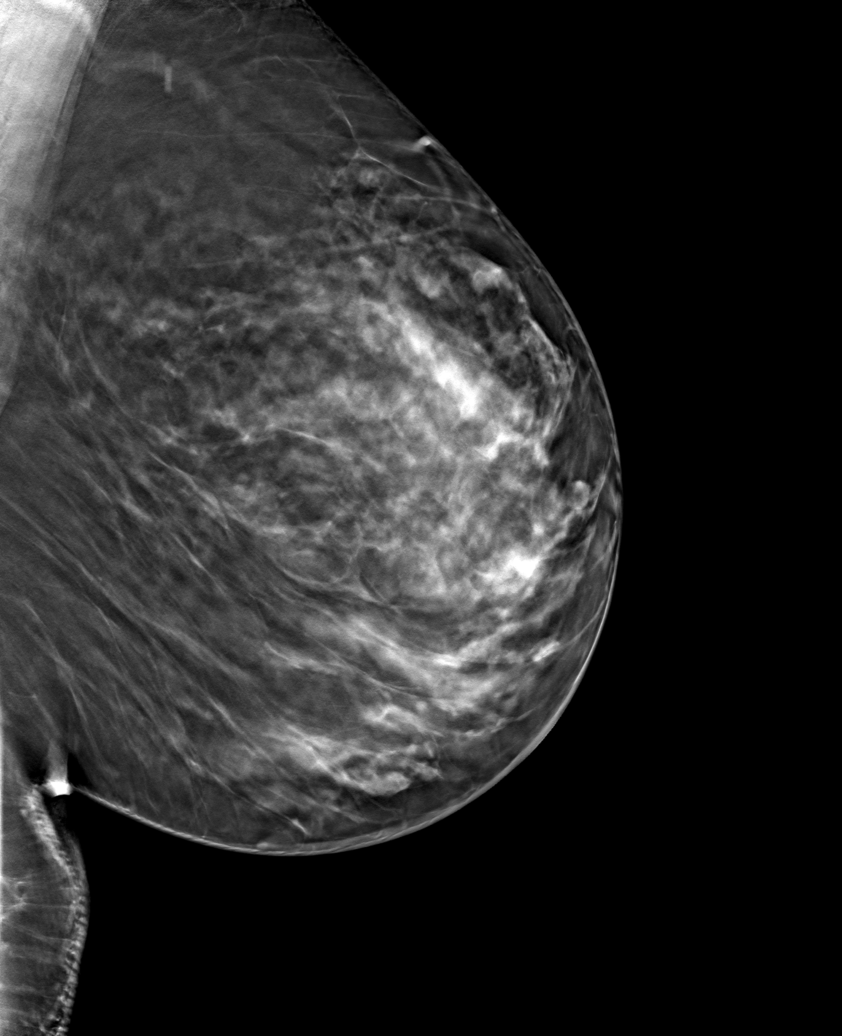

[6 of 18 positions shown; findings below may reference images not displayed]

ACR Breast Density Category c: The breast tissue is heterogeneously
dense, which may obscure small masses.
FINDINGS: On today's additional diagnostic views, including spot compression
with 3D tomosynthesis, there is no persistent asymmetry within the
LEFT breast indicating superimposition of normal dense
fibroglandular tissues. There are no new dominant masses, suspicious
calcifications or secondary signs of malignancy identified on
today's exam.

Mammographic images were processed with CAD.
IMPRESSION: No evidence of malignancy. Patient may return to routine annual
bilateral screening mammogram schedule.

RECOMMENDATION:
Screening mammogram in one year.(Code:[id number]-[id number])

I have discussed the findings and recommendations with the patient.
Results were also provided in writing at the conclusion of the
visit. If applicable, a reminder letter will be sent to the patient
regarding the next appointment.

BI-RADS CATEGORY  1: Negative.

## 2019-06-26 ENCOUNTER — Other Ambulatory Visit: Payer: Self-pay

## 2019-06-26 ENCOUNTER — Encounter: Payer: Self-pay | Admitting: Nurse Practitioner

## 2019-06-26 ENCOUNTER — Ambulatory Visit (INDEPENDENT_AMBULATORY_CARE_PROVIDER_SITE_OTHER): Payer: Managed Care, Other (non HMO) | Admitting: Nurse Practitioner

## 2019-06-26 DIAGNOSIS — E039 Hypothyroidism, unspecified: Secondary | ICD-10-CM

## 2019-06-26 DIAGNOSIS — F329 Major depressive disorder, single episode, unspecified: Secondary | ICD-10-CM

## 2019-06-26 DIAGNOSIS — F411 Generalized anxiety disorder: Secondary | ICD-10-CM

## 2019-06-26 DIAGNOSIS — F5101 Primary insomnia: Secondary | ICD-10-CM

## 2019-06-26 DIAGNOSIS — F32A Depression, unspecified: Secondary | ICD-10-CM

## 2019-06-26 DIAGNOSIS — I10 Essential (primary) hypertension: Secondary | ICD-10-CM | POA: Diagnosis not present

## 2019-06-26 DIAGNOSIS — E7800 Pure hypercholesterolemia, unspecified: Secondary | ICD-10-CM

## 2019-06-26 MED ORDER — ZOLPIDEM TARTRATE 10 MG PO TABS: 10.0000 mg | ORAL_TABLET | Freq: Every evening | ORAL | 0 refills | Status: DC | PRN

## 2019-06-26 MED ORDER — CLONAZEPAM 0.5 MG PO TABS: ORAL_TABLET | ORAL | 2 refills | Status: DC

## 2019-06-26 MED ORDER — DESOGESTREL-ETHINYL ESTRADIOL 0.15-30 MG-MCG PO TABS: 1.0000 | ORAL_TABLET | Freq: Every day | ORAL | 3 refills | Status: DC

## 2019-06-26 NOTE — Assessment & Plan Note (Signed)
Continue diet focus at this time and recheck lipid panel at next visit.

## 2019-06-26 NOTE — Assessment & Plan Note (Signed)
Chronic, stable with intermittent use of Klonopin.  Refill sent #30 with 2 refills.  Obtain UDS and controlled substance contract next face to face visit.  Recommend she return to therapy during the Covid pandemic to help with mood and exercises.  Return in 6 months or sooner if worsening symptoms.

## 2019-06-26 NOTE — Assessment & Plan Note (Signed)
Chronic, stable.  Continue current Levothyroxine dose and adjusted as needed.  Return in 6 months for labs and visit.

## 2019-06-26 NOTE — Assessment & Plan Note (Signed)
Chronic, stable. To continue therapeutic exercises learned in therapy and PRN clonazepam as needed for acute events. To notify provider of increase in anxious mood or increased need for medication use.  Klonopin refill sent #30 with 2 refill.  Obtain UDS and contract next visit.  Follow-up in 6 months or sooner if worsening symptoms.

## 2019-06-26 NOTE — Patient Instructions (Signed)
Living With Depression Everyone experiences occasional disappointment, sadness, and loss in their lives. When you are feeling down, blue, or sad for at least 2 weeks in a row, it may mean that you have depression. Depression can affect your thoughts and feelings, relationships, daily activities, and physical health. It is caused by changes in the way your brain functions. If you receive a diagnosis of depression, your health care provider will tell you which type of depression you have and what treatment options are available to you. If you are living with depression, there are ways to help you recover from it and also ways to prevent it from coming back. How to cope with lifestyle changes Coping with stress     Stress is your body's reaction to life changes and events, both good and bad. Stressful situations may include:  Getting married.  The death of a spouse.  Losing a job.  Retiring.  Having a baby. Stress can last just a few hours or it can be ongoing. Stress can play a major role in depression, so it is important to learn both how to cope with stress and how to think about it differently. Talk with your health care provider or a counselor if you would like to learn more about stress reduction. He or she may suggest some stress reduction techniques, such as:  Music therapy. This can include creating music or listening to music. Choose music that you enjoy and that inspires you.  Mindfulness-based meditation. This kind of meditation can be done while sitting or walking. It involves being aware of your normal breaths, rather than trying to control your breathing.  Centering prayer. This is a kind of meditation that involves focusing on a spiritual word or phrase. Choose a word, phrase, or sacred image that is meaningful to you and that brings you peace.  Deep breathing. To do this, expand your stomach and inhale slowly through your nose. Hold your breath for 3-5 seconds, then exhale  slowly, allowing your stomach muscles to relax.  Muscle relaxation. This involves intentionally tensing muscles then relaxing them. Choose a stress reduction technique that fits your lifestyle and personality. Stress reduction techniques take time and practice to develop. Set aside 5-15 minutes a day to do them. Therapists can offer training in these techniques. The training may be covered by some insurance plans. Other things you can do to manage stress include:  Keeping a stress diary. This can help you learn what triggers your stress and ways to control your response.  Understanding what your limits are and saying no to requests or events that lead to a schedule that is too full.  Thinking about how you respond to certain situations. You may not be able to control everything, but you can control how you react.  Adding humor to your life by watching funny films or TV shows.  Making time for activities that help you relax and not feeling guilty about spending your time this way.  Medicines Your health care provider may suggest certain medicines if he or she feels that they will help improve your condition. Avoid using alcohol and other substances that may prevent your medicines from working properly (may interact). It is also important to:  Talk with your pharmacist or health care provider about all the medicines that you take, their possible side effects, and what medicines are safe to take together.  Make it your goal to take part in all treatment decisions (shared decision-making). This includes giving input on   the side effects of medicines. It is best if shared decision-making with your health care provider is part of your total treatment plan. If your health care provider prescribes a medicine, you may not notice the full benefits of it for 4-8 weeks. Most people who are treated for depression need to be on medicine for at least 6-12 months after they feel better. If you are taking  medicines as part of your treatment, do not stop taking medicines without first talking to your health care provider. You may need to have the medicine slowly decreased (tapered) over time to decrease the risk of harmful side effects. Relationships Your health care provider may suggest family therapy along with individual therapy and drug therapy. While there may not be family problems that are causing you to feel depressed, it is still important to make sure your family learns as much as they can about your mental health. Having your family's support can help make your treatment successful. How to recognize changes in your condition Everyone has a different response to treatment for depression. Recovery from major depression happens when you have not had signs of major depression for two months. This may mean that you will start to:  Have more interest in doing activities.  Feel less hopeless than you did 2 months ago.  Have more energy.  Overeat less often, or have better or improving appetite.  Have better concentration. Your health care provider will work with you to decide the next steps in your recovery. It is also important to recognize when your condition is getting worse. Watch for these signs:  Having fatigue or low energy.  Eating too much or too little.  Sleeping too much or too little.  Feeling restless, agitated, or hopeless.  Having trouble concentrating or making decisions.  Having unexplained physical complaints.  Feeling irritable, angry, or aggressive. Get help as soon as you or your family members notice these symptoms coming back. How to get support and help from others How to talk with friends and family members about your condition  Talking to friends and family members about your condition can provide you with one way to get support and guidance. Reach out to trusted friends or family members, explain your symptoms to them, and let them know that you are  working with a health care provider to treat your depression. Financial resources Not all insurance plans cover mental health care, so it is important to check with your insurance carrier. If paying for co-pays or counseling services is a problem, search for a local or county mental health care center. They may be able to offer public mental health care services at low or no cost when you are not able to see a private health care provider. If you are taking medicine for depression, you may be able to get the generic form, which may be less expensive. Some makers of prescription medicines also offer help to patients who cannot afford the medicines they need. Follow these instructions at home:   Get the right amount and quality of sleep.  Cut down on using caffeine, tobacco, alcohol, and other potentially harmful substances.  Try to exercise, such as walking or lifting small weights.  Take over-the-counter and prescription medicines only as told by your health care provider.  Eat a healthy diet that includes plenty of vegetables, fruits, whole grains, low-fat dairy products, and lean protein. Do not eat a lot of foods that are high in solid fats, added sugars, or salt.    Keep all follow-up visits as told by your health care provider. This is important. Contact a health care provider if:  You stop taking your antidepressant medicines, and you have any of these symptoms: ? Nausea. ? Headache. ? Feeling lightheaded. ? Chills and body aches. ? Not being able to sleep (insomnia).  You or your friends and family think your depression is getting worse. Get help right away if:  You have thoughts of hurting yourself or others. If you ever feel like you may hurt yourself or others, or have thoughts about taking your own life, get help right away. You can go to your nearest emergency department or call:  Your local emergency services (911 in the [place]  A suicide crisis helpline, such as the  National Suicide Prevention Lifeline at 1-800-273-8255. This is open 24-hours a day. Summary  If you are living with depression, there are ways to help you recover from it and also ways to prevent it from coming back.  Work with your health care team to create a management plan that includes counseling, stress management techniques, and healthy lifestyle habits. This information is not intended to replace advice given to you by your health care provider. Make sure you discuss any questions you have with your health care provider. Document Released: 09/25/2016 Document Revised: 02/14/2019 Document Reviewed: 09/25/2016 Elsevier Patient Education  2020 [employer]  

## 2019-06-26 NOTE — Assessment & Plan Note (Signed)
Chronic, stable with BP below goal at home.  Continue current medication regimen and adjust as needed.  Return in 6 months.

## 2019-06-26 NOTE — Assessment & Plan Note (Signed)
Chronic, stable with intermittent Ambien use.  Discussed sleep hygiene methods that may benefit her.  Refill sent #30 with 0 refill.  Return to office in 6 months or sooner if worsening symptoms.

## 2019-06-26 NOTE — Progress Notes (Signed)
There were no vitals taken for this visit.   Subjective:    Patient ID: Laurie Martinez, female    DOB: 12/16/1970, 48 y.o.   MRN: [id number]  HPI: Laurie Martinez is a 48 y.o. female  Chief Complaint  Patient presents with  . Hypertension  . Hypothyroidism  . Depression    . This visit was completed via telephone due to the restrictions of the COVID-19 pandemic. All issues as above were discussed and addressed but no physical exam was performed. If it was felt that the patient should be evaluated in the office, they were directed there. The patient verbally consented to this visit. Patient was unable to complete an audio/visual visit due to Technical difficulties,Lack of internet. Due to the catastrophic nature of the COVID-19 pandemic, this visit was done through audio contact only. . Location of the patient: home . Location of the provider: home . Those involved with this call:  . Provider: Aura DialsJolene Cannady, DNP . CMA: Wilhemena DurieBrittany Russell, CMA . Front Desk/Registration: Laurie Martinez  . Time spent on call: 15 minutes on the phone discussing health concerns. 10 minutes total spent in review of patient's record and preparation of their chart.  . I verified patient identity using two factors (patient name and date of birth). Patient consents verbally to being seen via telemedicine visit today.    HYPERTENSION Continues on Lisinopril 5 MG daily. Hypertension status: stable  Satisfied with current treatment? yes Duration of hypertension: chronic BP monitoring frequency:  was checking BP prior to cuff breaking BP range: 130/80 range often BP medication side effects:  no Medication compliance: good compliance Aspirin: no Recurrent headaches: no Visual changes: no Palpitations: no Dyspnea: no Chest pain: no Lower extremity edema: no Dizzy/lightheaded: no   HYPERLIPIDEMIA No current medications, diet focus.  Last LDL 121 and TCHOL 215. Hyperlipidemia status: good diet focus  Satisfied with current treatment?  yes Side effects:  no Medication compliance: good compliance Supplements: none Aspirin:  no The 10-year ASCVD risk score Denman George(Goff DC Jr., et al., 2013) is: 1.2%   Values used to calculate the score:     Age: [age] years     Sex: Female     Is Non-Hispanic African American: No     Diabetic: No     Tobacco smoker: No     Systolic Blood Pressure: 132 mmHg     Is BP treated: Yes     HDL Cholesterol: 65 mg/dL     Total Cholesterol: 215 mg/dL Chest pain:  no Coronary artery disease:  no Family history CAD:  no Family history early CAD:  no  HYPOTHYROIDISM On Levothyroxine 50 MCG daily with last TSH 1.880. Thyroid control status:stable Satisfied with current treatment? yes Medication side effects: no Medication compliance: good compliance Etiology of hypothyroidism: unknown Recent dose adjustment:no Fatigue: no Cold intolerance: no Heat intolerance: no Weight gain: no Weight loss: no Constipation: no Diarrhea/loose stools: no Palpitations: no Lower extremity edema: no Anxiety/depressed mood: no   DEPRESSION/ANXIETY & INSOMNIA Continues Ambien and Klonopin. Pt reports satisfaction with current treatment options and wishes to continue. Pt is aware of risks of benzo medication use to include increased sedation, respiratory suppression, falls, extrapyramidal movements,  dependence and cardiovascular events.  Pt would like to continue treatment as benefit determined to outweigh risk.  Klonopin use less than once a month as needed for severe symptoms. Reports difficulty sleeping most nights, but reserves use of Ambien for weekends when she is able to get  a full nights rest and has limited responsibilities the following day.  Continues use of therapeutic exercises learned in therapy to decrease depression and anxiety symptoms. Reports depression symptoms are worse in the winter, but are well controlled with this method. Has taken daily medication in the past,  but does not like the way they make her feel and would prefer to manage her symptoms in a natural manner as much as possible.  Does report some increase anxiety with Covid. Mood status: stable Satisfied with current treatment?: yes Symptom severity: mild  Duration of current treatment : chronic Side effects: no Medication compliance: good compliance Psychotherapy/counseling: yes in the past Depressed mood: yes Anxious mood: yes Anhedonia: no Significant weight loss or gain: no Insomnia: no none with Ambien Fatigue: yes Feelings of worthlessness or guilt: no Impaired concentration/indecisiveness: yes Suicidal ideations: no Hopelessness: no Crying spells: no Depression screen [facility] 2[facility]9 06/26/2019 12/25/2018 06/18/2018 12/19/2017 08/15/2017  Decreased Interest 1 0 0 0 1  Down, Depressed, Hopeless 0 0 0 0 1  PHQ - 2 Score 1 0 0 0 2  Altered sleeping 3 2 2 3 3   Tired, decreased energy 3 2 2 1 2   Change in appetite 0 0 1 0 1  Feeling bad or failure about yourself  0 0 0 0 0  Trouble concentrating 2 2 2 1 1   Moving slowly or fidgety/restless 0 0 - 0 0  Suicidal thoughts 0 0 0 0 0  PHQ-9 Score 9 6 7 5 9   Difficult doing work/chores Somewhat difficult Somewhat difficult - - -    Relevant past medical, surgical, family and social history reviewed and updated as indicated. Interim medical history since our last visit reviewed. Allergies and medications reviewed and updated.  Review of Systems  Constitutional: Negative for activity change, appetite change, diaphoresis, fatigue and fever.  Respiratory: Negative for cough, chest tightness and shortness of breath.   Cardiovascular: Negative for chest pain, palpitations and leg swelling.  Gastrointestinal: Negative for abdominal distention, abdominal pain, constipation, diarrhea, nausea and vomiting.  Endocrine: Negative for cold intolerance and heat intolerance.  Neurological: Negative for dizziness, syncope, weakness, light-headedness,  numbness and headaches.    Per HPI unless specifically indicated above     Objective:    There were no vitals taken for this visit.  Wt Readings from Last 3 Encounters:  12/25/18 195 lb (88.5 kg)  06/18/18 189 lb 8 oz (86 kg)  12/19/17 183 lb (83 kg)    Physical Exam   Unable to perform due to telephone visit only.  Results for orders placed or performed in visit on 12/25/18  CBC with Differential/Platelet  Result Value Ref Range   WBC 8.4 3.4 - 10.8 x10E3/uL   RBC 4.27 3.77 - 5.28 x10E6/uL   Hemoglobin 13.8 11.1 - 15.9 g/dL   Hematocrit 78.440.4 69.634.0 - 46.6 %   MCV 95 79 - 97 fL   MCH 32.3 26.6 - 33.0 pg   MCHC 34.2 31.5 - 35.7 g/dL   RDW 29.511.5 (L) 28.411.7 - 13.215.4 %   Platelets 267 150 - 450 x10E3/uL   Neutrophils 59 Not Estab. %   Lymphs 31 Not Estab. %   Monocytes 6 Not Estab. %   Eos 3 Not Estab. %   Basos 1 Not Estab. %   Neutrophils Absolute 5.1 1.4 - 7.0 x10E3/uL   Lymphocytes Absolute 2.6 0.7 - 3.1 x10E3/uL   Monocytes Absolute 0.5 0.1 - 0.9 x10E3/uL   EOS (ABSOLUTE) 0.2 0.0 -  0.4 x10E3/uL   Basophils Absolute 0.1 0.0 - 0.2 x10E3/uL   Immature Granulocytes 0 Not Estab. %   Immature Grans (Abs) 0.0 0.0 - 0.1 x10E3/uL  Comprehensive metabolic panel  Result Value Ref Range   Glucose 76 65 - 99 mg/dL   BUN 12 6 - 24 mg/dL   Creatinine, Ser 0.87 0.57 - 1.00 mg/dL   GFR calc non Af Amer 80 >59 mL/min/1.73   GFR calc Af Amer 92 >59 mL/min/1.73   BUN/Creatinine Ratio 14 9 - 23   Sodium 139 134 - 144 mmol/L   Potassium 4.5 3.5 - 5.2 mmol/L   Chloride 102 96 - 106 mmol/L   CO2 22 20 - 29 mmol/L   Calcium 9.7 8.7 - 10.2 mg/dL   Total Protein 7.6 6.0 - 8.5 g/dL   Albumin 4.3 3.8 - 4.8 g/dL   Globulin, Total 3.3 1.5 - 4.5 g/dL   Albumin/Globulin Ratio 1.3 1.2 - 2.2   Bilirubin Total 0.2 0.0 - 1.2 mg/dL   Alkaline Phosphatase 65 39 - 117 IU/L   AST 11 0 - 40 IU/L   ALT 11 0 - 32 IU/L  TSH  Result Value Ref Range   TSH 1.880 0.450 - 4.500 uIU/mL  Lipid Panel w/o  Chol/HDL Ratio  Result Value Ref Range   Cholesterol, Total 215 (H) 100 - 199 mg/dL   Triglycerides 143 0 - 149 mg/dL   HDL 65 >39 mg/dL   VLDL Cholesterol Cal 29 5 - 40 mg/dL   LDL Calculated 121 (H) 0 - 99 mg/dL  HgB A1c  Result Value Ref Range   Hgb A1c MFr Bld 5.5 4.8 - 5.6 %   Est. average glucose Bld gHb Est-mCnc 111 mg/dL  VITAMIN D 25 Hydroxy (Vit-D Deficiency, Fractures)  Result Value Ref Range   Vit D, 25-Hydroxy 75.0 30.0 - 100.0 ng/mL      Assessment & Plan:   Problem List Items Addressed This Visit      Cardiovascular and Mediastinum   Hypertension - Primary    Chronic, stable with BP below goal at home.  Continue current medication regimen and adjust as needed.  Return in 6 months.        Endocrine   Hypothyroidism    Chronic, stable.  Continue current Levothyroxine dose and adjusted as needed.  Return in 6 months for labs and visit.        Other   Insomnia    Chronic, stable with intermittent Ambien use.  Discussed sleep hygiene methods that may benefit her.  Refill sent #30 with 0 refill.  Return to office in 6 months or sooner if worsening symptoms.      Depression    Chronic, stable with intermittent use of Klonopin.  Refill sent #30 with 2 refills.  Obtain UDS and controlled substance contract next face to face visit.  Recommend she return to therapy during the Covid pandemic to help with mood and exercises.  Return in 6 months or sooner if worsening symptoms.      Generalized anxiety disorder    Chronic, stable. To continue therapeutic exercises learned in therapy and PRN clonazepam as needed for acute events. To notify provider of increase in anxious mood or increased need for medication use.  Klonopin refill sent #30 with 2 refill.  Obtain UDS and contract next visit.  Follow-up in 6 months or sooner if worsening symptoms.      Elevated LDL cholesterol level    Continue diet focus  at this time and recheck lipid panel at next visit.         I  discussed the assessment and treatment plan with the patient. The patient was provided an opportunity to ask questions and all were answered. The patient agreed with the plan and demonstrated an understanding of the instructions.   The patient was advised to call back or seek an in-person evaluation if the symptoms worsen or if the condition fails to improve as anticipated.   I provided 15 minutes of time during this encounter.  Follow up plan: Return in about 6 months (around 12/27/2019) for HTN/HLD, Hypothyroid, Mood (needs UDS and controlled subtance contract).

## 2019-06-30 ENCOUNTER — Other Ambulatory Visit: Payer: Self-pay | Admitting: Nurse Practitioner

## 2019-06-30 DIAGNOSIS — Z1231 Encounter for screening mammogram for malignant neoplasm of breast: Secondary | ICD-10-CM

## 2019-07-30 ENCOUNTER — Ambulatory Visit
Admission: RE | Admit: 2019-07-30 | Discharge: 2019-07-30 | Disposition: A | Discharge status: Home / Self Care | Payer: Managed Care, Other (non HMO) | Source: Ambulatory Visit | Attending: Nurse Practitioner | Admitting: Nurse Practitioner

## 2019-07-30 DIAGNOSIS — Z1231 Encounter for screening mammogram for malignant neoplasm of breast: Secondary | ICD-10-CM | POA: Insufficient documentation

## 2019-10-01 ENCOUNTER — Ambulatory Visit (INDEPENDENT_AMBULATORY_CARE_PROVIDER_SITE_OTHER): Payer: Managed Care, Other (non HMO) | Admitting: Nurse Practitioner

## 2019-10-01 ENCOUNTER — Other Ambulatory Visit: Payer: Self-pay

## 2019-10-01 ENCOUNTER — Encounter: Payer: Self-pay | Admitting: Nurse Practitioner

## 2019-10-01 VITALS — BP 118/88

## 2019-10-01 DIAGNOSIS — I10 Essential (primary) hypertension: Secondary | ICD-10-CM

## 2019-10-01 DIAGNOSIS — F329 Major depressive disorder, single episode, unspecified: Secondary | ICD-10-CM | POA: Diagnosis not present

## 2019-10-01 DIAGNOSIS — E039 Hypothyroidism, unspecified: Secondary | ICD-10-CM

## 2019-10-01 DIAGNOSIS — F32A Depression, unspecified: Secondary | ICD-10-CM

## 2019-10-01 DIAGNOSIS — F411 Generalized anxiety disorder: Secondary | ICD-10-CM

## 2019-10-01 DIAGNOSIS — E7800 Pure hypercholesterolemia, unspecified: Secondary | ICD-10-CM

## 2019-10-01 DIAGNOSIS — F5101 Primary insomnia: Secondary | ICD-10-CM | POA: Diagnosis not present

## 2019-10-01 NOTE — Assessment & Plan Note (Signed)
Chronic, stable. To continue therapeutic exercises learned in therapy and PRN clonazepam as needed for acute events. To notify provider of increase in anxious mood or increased need for medication use.   Obtain UDS and contract next visit.  Follow-up in February for annual physical.

## 2019-10-01 NOTE — Assessment & Plan Note (Signed)
Chronic, stable.  Continue current Levothyroxine dose and adjusted as needed.  Return as scheduled in February for physical.

## 2019-10-01 NOTE — Assessment & Plan Note (Addendum)
Chronic, stable with intermittent Ambien use.  Discussed sleep hygiene methods that may benefit her. Last fill on PMP review 06/26/19, she reports not needing refills at this time.  Return to office in February as scheduled for physical.

## 2019-10-01 NOTE — Assessment & Plan Note (Signed)
Continue diet focus at this time and recheck lipid panel at next visit.

## 2019-10-01 NOTE — Progress Notes (Signed)
BP 118/88    Subjective:    Patient ID: Laurie Martinez, female    DOB: [birth year], 48 y.o.   MRN: [id number]  HPI: Laurie Martinez is a 48 y.o. female  Chief Complaint  Patient presents with  . Hypertension  . Depression    . This visit was completed via telephone due to the restrictions of the COVID-19 pandemic. All issues as above were discussed and addressed but no physical exam was performed. If it was felt that the patient should be evaluated in the office, they were directed there. The patient verbally consented to this visit. Patient was unable to complete an audio/visual visit due to Technical difficulties,Lack of internet. Due to the catastrophic nature of the COVID-19 pandemic, this visit was done through audio contact only. . Location of the patient: home . Location of the provider: work . Those involved with this call:  . Provider: Aura Dials, DNP . CMA: Wilhemena Durie, CMA . Front Desk/Registration: Harriet Pho  . Time spent on call: 15 minutes on the phone discussing health concerns. 10 minutes total spent in review of patient's record and preparation of their chart.  . I verified patient identity using two factors (patient name and date of birth). Patient consents verbally to being seen via telemedicine visit today.    HYPERTENSION Continues on Lisinopril 5 MG daily. Hypertension status: stable  Satisfied with current treatment? yes Duration of hypertension: chronic BP monitoring frequency:  a few times a week BP range: 130/80 range often BP medication side effects:  no Medication compliance: good compliance Aspirin: no Recurrent headaches: no Visual changes: no Palpitations: no Dyspnea: no Chest pain: no Lower extremity edema: no Dizzy/lightheaded: no   HYPERLIPIDEMIA No current medications, diet focus.  Last LDL 121 and TCHOL 215. Has decreased fried food intake. Hyperlipidemia status: good diet focus Satisfied with current treatment?  yes  Side effects:  no Medication compliance: good compliance Supplements: none Aspirin:  no The 10-year ASCVD risk score [name], et al., 2013) is: 1.2%   Values used to calculate the score:     Age: 14 years     Sex: Female     Is Non-Hispanic African American: No     Diabetic: No     Tobacco smoker: No     Systolic Blood Pressure: 132 mmHg     Is BP treated: Yes     HDL Cholesterol: 65 mg/dL     Total Cholesterol: 215 mg/dL Chest pain:  no Coronary artery disease:  no Family history CAD:  no Family history early CAD:  no  HYPOTHYROIDISM On Levothyroxine 50 MCG daily with last TSH 1.880. Thyroid control status:stable Satisfied with current treatment? yes Medication side effects: no Medication compliance: good compliance Etiology of hypothyroidism: unknown Recent dose adjustment:no Fatigue: no Cold intolerance: no Heat intolerance: no Weight gain: no Weight loss: no Constipation: no Diarrhea/loose stools: no Palpitations: no Lower extremity edema: no Anxiety/depressed mood: no   DEPRESSION/ANXIETY & INSOMNIA Continues Ambien and Klonopin.Pt reports satisfaction with current treatment options and wishes to continue.Pt is awareof risks ofbenzomedication use to include increased sedation, respiratory suppression, falls, extrapyramidal movements, dependence and cardiovascular events. Ptwould like to continue treatment as benefit determined to outweigh risk.At baselineKlonopin use less than once a month as needed for severe symptoms, has been using a little more lately as winter months are hardest for anxiety. Reports difficulty sleeping most nights, but reserves use of Ambien for weekends and when not working. Continues use  of therapeutic exercises learned in therapy to decrease depression and anxiety symptoms. Reports depression symptoms are worse in the winter, but are well controlled with this method.  Hypertension status: stable  Satisfied with current  treatment? yes Duration of hypertension: chronic BP monitoring frequency:  was checking BP prior to cuff breaking BP range: 130/80 range often BP medication side effects:  no Medication compliance: good compliance Aspirin: no Recurrent headaches: no Visual changes: no Palpitations: no Dyspnea: no Chest pain: no Lower extremity edema: no Dizzy/lightheaded: no  Depression screen [facility] 2/9 10/01/2019 06/26/2019 12/25/2018 06/18/2018 12/19/2017  Decreased Interest 1 1 0 0 0  Down, Depressed, Hopeless 1 0 0 0 0  PHQ - 2 Score 2 1 0 0 0  Altered sleeping 3 3 2 2 3   Tired, decreased energy 3 3 2 2 1   Change in appetite 0 0 0 1 0  Feeling bad or failure about yourself  0 0 0 0 0  Trouble concentrating 1 2 2 2 1   Moving slowly or fidgety/restless 0 0 0 - 0  Suicidal thoughts 0 0 0 0 0  PHQ-9 Score 9 9 6 7 5   Difficult doing work/chores Not difficult at all Somewhat difficult Somewhat difficult - -  Some recent data might be hidden    Relevant past medical, surgical, family and social history reviewed and updated as indicated. Interim medical history since our last visit reviewed. Allergies and medications reviewed and updated.  Review of Systems  Constitutional: Negative for activity change, appetite change, diaphoresis, fatigue and fever.  Respiratory: Negative for cough, chest tightness and shortness of breath.   Cardiovascular: Negative for chest pain, palpitations and leg swelling.  Gastrointestinal: Negative for abdominal distention, abdominal pain, constipation, diarrhea, nausea and vomiting.  Endocrine: Negative for cold intolerance and heat intolerance.  Neurological: Negative for dizziness, syncope, weakness, light-headedness, numbness and headaches.  Psychiatric/Behavioral: Negative.     Per HPI unless specifically indicated above     Objective:    BP 118/88   Wt Readings from Last 3 Encounters:  12/25/18 195 lb (88.5 kg)  06/18/18 189 lb 8 oz (86 kg)  12/19/17 183 lb  (83 kg)    Physical Exam   Unable to perform due to telephone visit only.  Results for orders placed or performed in visit on 12/25/18  CBC with Differential/Platelet  Result Value Ref Range   WBC 8.4 3.4 - 10.8 x10E3/uL   RBC 4.27 3.77 - 5.28 x10E6/uL   Hemoglobin 13.8 11.1 - 15.9 g/dL   Hematocrit 12/27/18 06/20/18 - 46.6 %   MCV 95 79 - 97 fL   MCH 32.3 26.6 - 33.0 pg   MCHC 34.2 31.5 - 35.7 g/dL   RDW 12/21/17 (L) 12/27/18 - 94.4 %   Platelets 267 150 - 450 x10E3/uL   Neutrophils 59 Not Estab. %   Lymphs 31 Not Estab. %   Monocytes 6 Not Estab. %   Eos 3 Not Estab. %   Basos 1 Not Estab. %   Neutrophils Absolute 5.1 1.4 - 7.0 x10E3/uL   Lymphocytes Absolute 2.6 0.7 - 3.1 x10E3/uL   Monocytes Absolute 0.5 0.1 - 0.9 x10E3/uL   EOS (ABSOLUTE) 0.2 0.0 - 0.4 x10E3/uL   Basophils Absolute 0.1 0.0 - 0.2 x10E3/uL   Immature Granulocytes 0 Not Estab. %   Immature Grans (Abs) 0.0 0.0 - 0.1 x10E3/uL  Comprehensive metabolic panel  Result Value Ref Range   Glucose 76 65 - 99 mg/dL   BUN 12  6 - 24 mg/dL   Creatinine, Ser 1.610.87 0.57 - 1.00 mg/dL   GFR calc non Af Amer 80 >59 mL/min/1.73   GFR calc Af Amer 92 >59 mL/min/1.73   BUN/Creatinine Ratio 14 9 - 23   Sodium 139 134 - 144 mmol/L   Potassium 4.5 3.5 - 5.2 mmol/L   Chloride 102 96 - 106 mmol/L   CO2 22 20 - 29 mmol/L   Calcium 9.7 8.7 - 10.2 mg/dL   Total Protein 7.6 6.0 - 8.5 g/dL   Albumin 4.3 3.8 - 4.8 g/dL   Globulin, Total 3.3 1.5 - 4.5 g/dL   Albumin/Globulin Ratio 1.3 1.2 - 2.2   Bilirubin Total 0.2 0.0 - 1.2 mg/dL   Alkaline Phosphatase 65 39 - 117 IU/L   AST 11 0 - 40 IU/L   ALT 11 0 - 32 IU/L  TSH  Result Value Ref Range   TSH 1.880 0.450 - 4.500 uIU/mL  Lipid Panel w/o Chol/HDL Ratio  Result Value Ref Range   Cholesterol, Total 215 (H) 100 - 199 mg/dL   Triglycerides 096143 0 - 149 mg/dL   HDL 65 >04>39 mg/dL   VLDL Cholesterol Cal 29 5 - 40 mg/dL   LDL Calculated 540121 (H) 0 - 99 mg/dL  HgB [id number]  Result Value Ref Range    Hgb A1c MFr Bld 5.5 4.8 - 5.6 %   Est. average glucose Bld gHb Est-mCnc 111 mg/dL  VITAMIN D 25 Hydroxy (Vit-D Deficiency, Fractures)  Result Value Ref Range   Vit D, 25-Hydroxy 75.0 30.0 - 100.0 ng/mL      Assessment & Plan:   Problem List Items Addressed This Visit      Cardiovascular and Mediastinum   Hypertension    Chronic, stable with BP below goal at home.  Continue current medication regimen and adjust as needed.  Return in 6 months.        Endocrine   Hypothyroidism    Chronic, stable.  Continue current Levothyroxine dose and adjusted as needed.  Return as scheduled in February for physical.        Other   Insomnia    Chronic, stable with intermittent Ambien use.  Discussed sleep hygiene methods that may benefit her. Last fill on PMP review 06/26/19, she reports not needing refills at this time.  Return to office in February as scheduled for physical.      Depression - Primary    Chronic, stable. To continue therapeutic exercises learned in therapy and PRN clonazepam as needed for acute events. To notify provider of increase in anxious mood or increased need for medication use.   Obtain UDS and contract next visit.  Follow-up in February for annual physical.      Generalized anxiety disorder    Chronic, stable with intermittent use of Klonopin.  Last fill on PMP review 06/26/19, she reports not needing refills at this time.  Obtain UDS and controlled substance contract next face to face visit.  Return in February for annual physical.      Elevated LDL cholesterol level    Continue diet focus at this time and recheck lipid panel at next visit.         I discussed the assessment and treatment plan with the patient. The patient was provided an opportunity to ask questions and all were answered. The patient agreed with the plan and demonstrated an understanding of the instructions.   The patient was advised to call back or seek an in-person  evaluation if the symptoms  worsen or if the condition fails to improve as anticipated.   I provided 15 minutes of time during this encounter.  Follow up plan: Return physical in February scheduled.

## 2019-10-01 NOTE — Patient Instructions (Signed)
Living With Depression Everyone experiences occasional disappointment, sadness, and loss in their lives. When you are feeling down, blue, or sad for at least 2 weeks in a row, it may mean that you have depression. Depression can affect your thoughts and feelings, relationships, daily activities, and physical health. It is caused by changes in the way your brain functions. If you receive a diagnosis of depression, your health care provider will tell you which type of depression you have and what treatment options are available to you. If you are living with depression, there are ways to help you recover from it and also ways to prevent it from coming back. How to cope with lifestyle changes Coping with stress     Stress is your body's reaction to life changes and events, both good and bad. Stressful situations may include:  Getting married.  The death of a spouse.  Losing a job.  Retiring.  Having a baby. Stress can last just a few hours or it can be ongoing. Stress can play a major role in depression, so it is important to learn both how to cope with stress and how to think about it differently. Talk with your health care provider or a counselor if you would like to learn more about stress reduction. He or she may suggest some stress reduction techniques, such as:  Music therapy. This can include creating music or listening to music. Choose music that you enjoy and that inspires you.  Mindfulness-based meditation. This kind of meditation can be done while sitting or walking. It involves being aware of your normal breaths, rather than trying to control your breathing.  Centering prayer. This is a kind of meditation that involves focusing on a spiritual word or phrase. Choose a word, phrase, or sacred image that is meaningful to you and that brings you peace.  Deep breathing. To do this, expand your stomach and inhale slowly through your nose. Hold your breath for 3-5 seconds, then exhale  slowly, allowing your stomach muscles to relax.  Muscle relaxation. This involves intentionally tensing muscles then relaxing them. Choose a stress reduction technique that fits your lifestyle and personality. Stress reduction techniques take time and practice to develop. Set aside 5-15 minutes a day to do them. Therapists can offer training in these techniques. The training may be covered by some insurance plans. Other things you can do to manage stress include:  Keeping a stress diary. This can help you learn what triggers your stress and ways to control your response.  Understanding what your limits are and saying no to requests or events that lead to a schedule that is too full.  Thinking about how you respond to certain situations. You may not be able to control everything, but you can control how you react.  Adding humor to your life by watching funny films or TV shows.  Making time for activities that help you relax and not feeling guilty about spending your time this way.  Medicines Your health care provider may suggest certain medicines if he or she feels that they will help improve your condition. Avoid using alcohol and other substances that may prevent your medicines from working properly (may interact). It is also important to:  Talk with your pharmacist or health care provider about all the medicines that you take, their possible side effects, and what medicines are safe to take together.  Make it your goal to take part in all treatment decisions (shared decision-making). This includes giving input on   the side effects of medicines. It is best if shared decision-making with your health care provider is part of your total treatment plan. If your health care provider prescribes a medicine, you may not notice the full benefits of it for 4-8 weeks. Most people who are treated for depression need to be on medicine for at least 6-12 months after they feel better. If you are taking  medicines as part of your treatment, do not stop taking medicines without first talking to your health care provider. You may need to have the medicine slowly decreased (tapered) over time to decrease the risk of harmful side effects. Relationships Your health care provider may suggest family therapy along with individual therapy and drug therapy. While there may not be family problems that are causing you to feel depressed, it is still important to make sure your family learns as much as they can about your mental health. Having your family's support can help make your treatment successful. How to recognize changes in your condition Everyone has a different response to treatment for depression. Recovery from major depression happens when you have not had signs of major depression for two months. This may mean that you will start to:  Have more interest in doing activities.  Feel less hopeless than you did 2 months ago.  Have more energy.  Overeat less often, or have better or improving appetite.  Have better concentration. Your health care provider will work with you to decide the next steps in your recovery. It is also important to recognize when your condition is getting worse. Watch for these signs:  Having fatigue or low energy.  Eating too much or too little.  Sleeping too much or too little.  Feeling restless, agitated, or hopeless.  Having trouble concentrating or making decisions.  Having unexplained physical complaints.  Feeling irritable, angry, or aggressive. Get help as soon as you or your family members notice these symptoms coming back. How to get support and help from others How to talk with friends and family members about your condition  Talking to friends and family members about your condition can provide you with one way to get support and guidance. Reach out to trusted friends or family members, explain your symptoms to them, and let them know that you are  working with a health care provider to treat your depression. Financial resources Not all insurance plans cover mental health care, so it is important to check with your insurance carrier. If paying for co-pays or counseling services is a problem, search for a local or county mental health care center. They may be able to offer public mental health care services at low or no cost when you are not able to see a private health care provider. If you are taking medicine for depression, you may be able to get the generic form, which may be less expensive. Some makers of prescription medicines also offer help to patients who cannot afford the medicines they need. Follow these instructions at home:   Get the right amount and quality of sleep.  Cut down on using caffeine, tobacco, alcohol, and other potentially harmful substances.  Try to exercise, such as walking or lifting small weights.  Take over-the-counter and prescription medicines only as told by your health care provider.  Eat a healthy diet that includes plenty of vegetables, fruits, whole grains, low-fat dairy products, and lean protein. Do not eat a lot of foods that are high in solid fats, added sugars, or salt.    Keep all follow-up visits as told by your health care provider. This is important. Contact a health care provider if:  You stop taking your antidepressant medicines, and you have any of these symptoms: ? Nausea. ? Headache. ? Feeling lightheaded. ? Chills and body aches. ? Not being able to sleep (insomnia).  You or your friends and family think your depression is getting worse. Get help right away if:  You have thoughts of hurting yourself or others. If you ever feel like you may hurt yourself or others, or have thoughts about taking your own life, get help right away. You can go to your nearest emergency department or call:  Your local emergency services (911 in the [place]  A suicide crisis helpline, such as the  National Suicide Prevention Lifeline at 1-800-273-8255. This is open 24-hours a day. Summary  If you are living with depression, there are ways to help you recover from it and also ways to prevent it from coming back.  Work with your health care team to create a management plan that includes counseling, stress management techniques, and healthy lifestyle habits. This information is not intended to replace advice given to you by your health care provider. Make sure you discuss any questions you have with your health care provider. Document Released: 09/25/2016 Document Revised: 02/14/2019 Document Reviewed: 09/25/2016 Elsevier Patient Education  2020 [employer]  

## 2019-10-01 NOTE — Assessment & Plan Note (Signed)
Chronic, stable with BP below goal at home.  Continue current medication regimen and adjust as needed.  Return in 6 months.

## 2019-10-01 NOTE — Assessment & Plan Note (Signed)
Chronic, stable with intermittent use of Klonopin.  Last fill on PMP review 06/26/19, she reports not needing refills at this time.  Obtain UDS and controlled substance contract next face to face visit.  Return in February for annual physical.

## 2019-12-03 ENCOUNTER — Other Ambulatory Visit: Payer: Self-pay | Admitting: Nurse Practitioner

## 2019-12-03 NOTE — Telephone Encounter (Signed)
Refill for controlled medication, Ambien.

## 2019-12-04 NOTE — Telephone Encounter (Signed)
Routing to provider  

## 2019-12-23 ENCOUNTER — Other Ambulatory Visit: Payer: Self-pay

## 2019-12-23 ENCOUNTER — Ambulatory Visit (INDEPENDENT_AMBULATORY_CARE_PROVIDER_SITE_OTHER): Payer: Managed Care, Other (non HMO) | Admitting: Nurse Practitioner

## 2019-12-23 ENCOUNTER — Encounter: Payer: Self-pay | Admitting: Nurse Practitioner

## 2019-12-23 VITALS — BP 107/68 | HR 77 | Temp 98.2°F | Ht 63.7 in | Wt 178.6 lb

## 2019-12-23 DIAGNOSIS — Z6830 Body mass index (BMI) 30.0-30.9, adult: Secondary | ICD-10-CM

## 2019-12-23 DIAGNOSIS — F411 Generalized anxiety disorder: Secondary | ICD-10-CM

## 2019-12-23 DIAGNOSIS — E039 Hypothyroidism, unspecified: Secondary | ICD-10-CM | POA: Diagnosis not present

## 2019-12-23 DIAGNOSIS — F330 Major depressive disorder, recurrent, mild: Secondary | ICD-10-CM

## 2019-12-23 DIAGNOSIS — I10 Essential (primary) hypertension: Secondary | ICD-10-CM | POA: Diagnosis not present

## 2019-12-23 DIAGNOSIS — E66811 Obesity, class 1: Secondary | ICD-10-CM

## 2019-12-23 DIAGNOSIS — E559 Vitamin D deficiency, unspecified: Secondary | ICD-10-CM

## 2019-12-23 DIAGNOSIS — G43909 Migraine, unspecified, not intractable, without status migrainosus: Secondary | ICD-10-CM

## 2019-12-23 DIAGNOSIS — Z0000 Encounter for general adult medical examination without abnormal findings: Secondary | ICD-10-CM

## 2019-12-23 DIAGNOSIS — J4520 Mild intermittent asthma, uncomplicated: Secondary | ICD-10-CM

## 2019-12-23 DIAGNOSIS — F5101 Primary insomnia: Secondary | ICD-10-CM

## 2019-12-23 DIAGNOSIS — E6609 Other obesity due to excess calories: Secondary | ICD-10-CM

## 2019-12-23 DIAGNOSIS — E7800 Pure hypercholesterolemia, unspecified: Secondary | ICD-10-CM

## 2019-12-23 DIAGNOSIS — Z79899 Other long term (current) drug therapy: Secondary | ICD-10-CM

## 2019-12-23 MED ORDER — MONTELUKAST SODIUM 10 MG PO TABS: 10.0000 mg | ORAL_TABLET | Freq: Every day | ORAL | 3 refills | Status: DC

## 2019-12-23 MED ORDER — LEVOTHYROXINE SODIUM 50 MCG PO TABS: 50.0000 ug | ORAL_TABLET | Freq: Every day | ORAL | 3 refills | Status: DC

## 2019-12-23 MED ORDER — LISINOPRIL 5 MG PO TABS: 5.0000 mg | ORAL_TABLET | Freq: Every day | ORAL | 3 refills | Status: DC

## 2019-12-23 MED ORDER — CLONAZEPAM 0.5 MG PO TABS: ORAL_TABLET | ORAL | 2 refills | Status: DC

## 2019-12-23 MED ORDER — FLUTICASONE-SALMETEROL 250-50 MCG/DOSE IN AEPB: 1.0000 | INHALATION_SPRAY | Freq: Two times a day (BID) | RESPIRATORY_TRACT | 3 refills | Status: DC

## 2019-12-23 NOTE — Assessment & Plan Note (Signed)
Chronic, stable with intermittent Ambien use.  Discussed sleep hygiene methods that may benefit her. Last fill on PMP review 12/04/2019, she reports not needing refills at this time.  Contract signed for controlled substance and UDS obtained.

## 2019-12-23 NOTE — Assessment & Plan Note (Signed)
Chronic, stable.  Continue current Levothyroxine dose and adjusted as needed.  Labs today. 

## 2019-12-23 NOTE — Assessment & Plan Note (Signed)
Praised for weight loss.  Recommend continued focus on healthy diet choices and regular physical activity (30 minutes 5 days a week).

## 2019-12-23 NOTE — Assessment & Plan Note (Signed)
Chronic, stable with intermittent use of Klonopin.  Last fill on PMP review 06/26/19, she reports not needing refills at this time.  Obtained UDS and controlled substance contract today.  Return in 6 months.

## 2019-12-23 NOTE — Assessment & Plan Note (Addendum)
Reports history of low level, check today and continue daily supplement, adjust as needed. 

## 2019-12-23 NOTE — Assessment & Plan Note (Signed)
Continue diet focus at this time and recheck lipid panel today.  ASCVD 0.9%.

## 2019-12-23 NOTE — Assessment & Plan Note (Signed)
Obtain UDS and controlled substance contract today.  Discussed at length risk of long term benzo use, at this time she uses appropriately, minimally.  Recommend continued minimal use.

## 2019-12-23 NOTE — Patient Instructions (Signed)
DASH Eating Plan DASH stands for "Dietary Approaches to Stop Hypertension." The DASH eating plan is a healthy eating plan that has been shown to reduce high blood pressure (hypertension). It may also reduce your risk for type 2 diabetes, heart disease, and stroke. The DASH eating plan may also help with weight loss. What are tips for following this plan?  General guidelines  Avoid eating more than 2,300 mg (milligrams) of salt (sodium) a day. If you have hypertension, you may need to reduce your sodium intake to 1,500 mg a day.  Limit alcohol intake to no more than 1 drink a day for nonpregnant women and 2 drinks a day for men. One drink equals 12 oz of beer, 5 oz of wine, or 1 oz of hard liquor.  Work with your health care provider to maintain a healthy body weight or to lose weight. Ask what an ideal weight is for you.  Get at least 30 minutes of exercise that causes your heart to beat faster (aerobic exercise) most days of the week. Activities may include walking, swimming, or biking.  Work with your health care provider or diet and nutrition specialist (dietitian) to adjust your eating plan to your individual calorie needs. Reading food labels   Check food labels for the amount of sodium per serving. Choose foods with less than 5 percent of the Daily Value of sodium. Generally, foods with less than 300 mg of sodium per serving fit into this eating plan.  To find whole grains, look for the word "whole" as the first word in the ingredient list. Shopping  Buy products labeled as "low-sodium" or "no salt added."  Buy fresh foods. Avoid canned foods and premade or frozen meals. Cooking  Avoid adding salt when cooking. Use salt-free seasonings or herbs instead of table salt or sea salt. Check with your health care provider or pharmacist before using salt substitutes.  Do not fry foods. Cook foods using healthy methods such as baking, boiling, grilling, and broiling instead.  Cook with  heart-healthy oils, such as olive, canola, soybean, or sunflower oil. Meal planning  Eat a balanced diet that includes: ? 5 or more servings of fruits and vegetables each day. At each meal, try to fill half of your plate with fruits and vegetables. ? Up to 6-8 servings of whole grains each day. ? Less than 6 oz of lean meat, poultry, or fish each day. A 3-oz serving of meat is about the same size as a deck of cards. One egg equals 1 oz. ? 2 servings of low-fat dairy each day. ? A serving of nuts, seeds, or beans 5 times each week. ? Heart-healthy fats. Healthy fats called Omega-3 fatty acids are found in foods such as flaxseeds and coldwater fish, like sardines, salmon, and mackerel.  Limit how much you eat of the following: ? Canned or prepackaged foods. ? Food that is high in trans fat, such as fried foods. ? Food that is high in saturated fat, such as fatty meat. ? Sweets, desserts, sugary drinks, and other foods with added sugar. ? Full-fat dairy products.  Do not salt foods before eating.  Try to eat at least 2 vegetarian meals each week.  Eat more home-cooked food and less restaurant, buffet, and fast food.  When eating at a restaurant, ask that your food be prepared with less salt or no salt, if possible. What foods are recommended? The items listed may not be a complete list. Talk with your dietitian about   what dietary choices are best for you. Grains Whole-grain or whole-wheat bread. Whole-grain or whole-wheat pasta. Brown rice. Oatmeal. Quinoa. Bulgur. Whole-grain and low-sodium cereals. Pita bread. Low-fat, low-sodium crackers. Whole-wheat flour tortillas. Vegetables Fresh or frozen vegetables (raw, steamed, roasted, or grilled). Low-sodium or reduced-sodium tomato and vegetable juice. Low-sodium or reduced-sodium tomato sauce and tomato paste. Low-sodium or reduced-sodium canned vegetables. Fruits All fresh, dried, or frozen fruit. Canned fruit in natural juice (without  added sugar). Meat and other protein foods Skinless chicken or turkey. Ground chicken or turkey. Pork with fat trimmed off. Fish and seafood. Egg whites. Dried beans, peas, or lentils. Unsalted nuts, nut butters, and seeds. Unsalted canned beans. Lean cuts of beef with fat trimmed off. Low-sodium, lean deli meat. Dairy Low-fat (1%) or fat-free (skim) milk. Fat-free, low-fat, or reduced-fat cheeses. Nonfat, low-sodium ricotta or cottage cheese. Low-fat or nonfat yogurt. Low-fat, low-sodium cheese. Fats and oils Soft margarine without trans fats. Vegetable oil. Low-fat, reduced-fat, or light mayonnaise and salad dressings (reduced-sodium). Canola, safflower, olive, soybean, and sunflower oils. Avocado. Seasoning and other foods Herbs. Spices. Seasoning mixes without salt. Unsalted popcorn and pretzels. Fat-free sweets. What foods are not recommended? The items listed may not be a complete list. Talk with your dietitian about what dietary choices are best for you. Grains Baked goods made with fat, such as croissants, muffins, or some breads. Dry pasta or rice meal packs. Vegetables Creamed or fried vegetables. Vegetables in a cheese sauce. Regular canned vegetables (not low-sodium or reduced-sodium). Regular canned tomato sauce and paste (not low-sodium or reduced-sodium). Regular tomato and vegetable juice (not low-sodium or reduced-sodium). Pickles. Olives. Fruits Canned fruit in a light or heavy syrup. Fried fruit. Fruit in cream or butter sauce. Meat and other protein foods Fatty cuts of meat. Ribs. Fried meat. Bacon. Sausage. Bologna and other processed lunch meats. Salami. Fatback. Hotdogs. Bratwurst. Salted nuts and seeds. Canned beans with added salt. Canned or smoked fish. Whole eggs or egg yolks. Chicken or turkey with skin. Dairy Whole or 2% milk, cream, and half-and-half. Whole or full-fat cream cheese. Whole-fat or sweetened yogurt. Full-fat cheese. Nondairy creamers. Whipped toppings.  Processed cheese and cheese spreads. Fats and oils Butter. Stick margarine. Lard. Shortening. Ghee. Bacon fat. Tropical oils, such as coconut, palm kernel, or palm oil. Seasoning and other foods Salted popcorn and pretzels. Onion salt, garlic salt, seasoned salt, table salt, and sea salt. Worcestershire sauce. Tartar sauce. Barbecue sauce. Teriyaki sauce. Soy sauce, including reduced-sodium. Steak sauce. Canned and packaged gravies. Fish sauce. Oyster sauce. Cocktail sauce. Horseradish that you find on the shelf. Ketchup. Mustard. Meat flavorings and tenderizers. Bouillon cubes. Hot sauce and Tabasco sauce. Premade or packaged marinades. Premade or packaged taco seasonings. Relishes. Regular salad dressings. Where to find more information:  National Heart, Lung, and Blood Institute: www.nhlbi.nih.gov  American Heart Association: www.heart.org Summary  The DASH eating plan is a healthy eating plan that has been shown to reduce high blood pressure (hypertension). It may also reduce your risk for type 2 diabetes, heart disease, and stroke.  With the DASH eating plan, you should limit salt (sodium) intake to 2,300 mg a day. If you have hypertension, you may need to reduce your sodium intake to 1,500 mg a day.  When on the DASH eating plan, aim to eat more fresh fruits and vegetables, whole grains, lean proteins, low-fat dairy, and heart-healthy fats.  Work with your health care provider or diet and nutrition specialist (dietitian) to adjust your eating plan to your   individual calorie needs. This information is not intended to replace advice given to you by your health care provider. Make sure you discuss any questions you have with your health care provider. Document Revised: 10/05/2017 Document Reviewed: 10/16/2016 Elsevier Patient Education  2020 [employer]  

## 2019-12-23 NOTE — Assessment & Plan Note (Signed)
Chronic, stable. Pt to continue current medication regimen as prescribed. Pt to notify provider with change in symptoms or increased use/need of rescue inhaler. Follow-up in appx 6 months or sooner if needs arise.  

## 2019-12-23 NOTE — Assessment & Plan Note (Addendum)
Chronic, stable with BP below goal at home and in office today with weight loss present.  Continue current medication regimen and adjust as needed, will consider D/C of medication if BP remains stable and weight loss continues.  Labs today.  Refills sent.  Return in 6 months.

## 2019-12-23 NOTE — Assessment & Plan Note (Signed)
Signed today 12/23/2019

## 2019-12-23 NOTE — Assessment & Plan Note (Signed)
Chronic, stable. To continue therapeutic exercises learned in therapy and PRN clonazepam as needed for acute events. To notify provider of increase in anxious mood or increased need for medication use.   Obtained UDS and contract today.  Follow-up in 6 months.

## 2019-12-23 NOTE — Assessment & Plan Note (Signed)
Chronic, stable. Continue desogestrel-ethinyl estradiol continuous for hormonal maintenance. Continue use of sumatriptan as prescribed PRN for migraine headaches. Discussed the option of continuing continuous birth control up to age 49 for migraine prevention or allowing a one week break every month for menstrual cycle. Pt opted for continual birth control use at this time. Educated on the importance of annual mammograms with this option. Pt to notify provider with any changes in headaches, frequency, or severity.  

## 2019-12-23 NOTE — Progress Notes (Signed)
BP 107/68   Pulse 77   Temp 98.2 F (36.8 C) (Oral)   Ht 5' 3.7" (1.618 m)   Wt 178 lb 9.6 oz (81 kg)   LMP  (LMP Unknown)   SpO2 97%   BMI 30.95 kg/m    Subjective:    Patient ID: Laurie Martinez, female    DOB: [birth year], 49 y.o.   MRN: [id number]  HPI: Laurie Martinez is a 49 y.o. female presenting on 12/23/2019 for comprehensive medical examination. Current medical complaints include:none  She currently lives with: sister Menopausal Symptoms: no   HYPERTENSION/HLD Continues lisinopril 5mg  daily. Has lost 10-15 pounds making better food choices, has given up soda.   Hypertension status: controlled  Satisfied with current treatment? yes Duration of hypertension: chronic BP monitoring frequency:  weekly BP range: 117 to 120/80's BP medication side effects:  no Medication compliance: good compliance Aspirin: no Recurrent headaches: history of chronic migraine headaches, not having as often due to BCP Visual changes: no Palpitations: no Dyspnea: no Chest pain: no Lower extremity edema: no Dizzy/lightheaded: no  The 10-year ASCVD risk score DC Jr., et al., 2013) is: 0.9%   Values used to calculate the score:     Age: 7 years     Sex: Female     Is Non-Hispanic African American: No     Diabetic: No     Tobacco smoker: No     Systolic Blood Pressure: 107 mmHg     Is BP treated: Yes     HDL Cholesterol: 65 mg/dL     Total Cholesterol: 215 mg/dL  HYPOTHYROIDISM Last TSH was 1.880 February 2020.  Continues on Levothyroxine 50 MCG. Thyroid control status:controlled Satisfied with current treatment? yes Medication side effects: no Medication compliance: good compliance Recent dose adjustment:no Fatigue: no Cold intolerance: no Heat intolerance: no Weight gain: no Weight loss: yes -- expected Constipation: yes- chronic issue- no change in baseline -- uses Miralax Diarrhea/loose stools: no Palpitations: no Lower extremity edema: no Anxiety/depressed  mood: yes - chronic issue- no change in baseline  ASTHMA Continues Singulair and Advair daily and Ventolin PRN. Her asthma is seasonal and exercise induced. Asthma status: controlled Satisfied with current treatment?: yes Albuterol/rescue inhaler frequency: no use since Christmas Dyspnea frequency: rare Wheezing frequency: rare Cough frequency: rare Nocturnal symptom frequency: none Limitation of activity: none Current upper respiratory symptoms: no Triggers: seasonal allergies in the spring and fall  Visits to ER or Urgent Care in past year: no Pneumovax: Up to Date Influenza: Not up to Date - patient declines the flu vaccine  DEPRESSION, ANXIETY, & INSOMNIA Continues Ambien and Klonopin.Pt reports satisfaction with current treatment options and wishes to continue.Pt is awareof risks ofbenzomedication use to include increased sedation, respiratory suppression, falls, dependence and cardiovascular events. Ptwould like to continue treatment as benefit determined to outweigh risk.At baselineKlonopin use less than once a month as needed for severe symptoms, has been using a little more lately as winter months are hardest for anxiety (not more than 5 times a month).Reports difficulty sleeping most nights, but reserves use ofAmbien for weekends and when not working. Continuesuse of therapeutic exercises learned in therapy to decrease depression and anxiety symptoms. Reports depression symptoms are worse in the winter, but are well controlled with this method.On review PMP last Ambien fill 12/04/2019 and Klonopin 06/26/2019.   Mood status: controlled Satisfied with current treatment?: yes Symptom severity: moderate  Duration of current treatment : chronic Side effects: no Medication compliance: good  compliance Psychotherapy/counseling: yes in the past Previous psychiatric medications: pt reports "several" medications > 5 years ago, but cannot recall the names Depressed mood:  yes Anxious mood:yes Anhedonia: no Significant weight loss or gain: no Insomnia: yes Fatigue: yes Feelings of worthlessness or guilt: no Impaired concentration/indecisiveness: ocassional Suicidal ideations: no Hopelessness: no Crying spells: no  Depression Screen done today and results listed below:  Depression screen [facility] 2/9 12/23/2019 10/01/2019 06/26/2019 12/25/2018 06/18/2018  Decreased Interest 1 1 1  0 0  Down, Depressed, Hopeless 1 1 0 0 0  PHQ - 2 Score 2 2 1  0 0  Altered sleeping 3 3 3 2 2   Tired, decreased energy 3 3 3 2 2   Change in appetite 1 0 0 0 1  Feeling bad or failure about yourself  0 0 0 0 0  Trouble concentrating 1 1 2 2 2   Moving slowly or fidgety/restless 0 0 0 0 -  Suicidal thoughts 0 0 0 0 0  PHQ-9 Score 10 9 9 6 7   Difficult doing work/chores Not difficult at all Not difficult at all Somewhat difficult Somewhat difficult -  Some recent data might be hidden   GAD 7 : Generalized Anxiety Score 12/23/2019 06/26/2019 12/25/2018 06/18/2018  Nervous, Anxious, on Edge 1 3 1 2   Control/stop worrying 1 1 0 2  Worry too much - different things 1 1 0 2  Trouble relaxing 1 1 1  -  Restless 1 0 1 1  Easily annoyed or irritable 1 1 0 1  Afraid - awful might happen 0 0 0 1  Total GAD 7 Score 6 7 3  -  Anxiety Difficulty Not difficult at all Somewhat difficult Somewhat difficult -     The patient does not have a history of falls. I did not complete a risk assessment for falls. A plan of care for falls was not documented.   Past Medical History:  Past Medical History:  Diagnosis Date  . Asthma   . Constipation   . Depression   . Hypertension   . Insomnia     Surgical History:  History reviewed. No pertinent surgical history.  Medications:  Current Outpatient Medications on File Prior to Visit  Medication Sig  . desogestrel-ethinyl estradiol (RECLIPSEN) 0.15-30 MG-MCG tablet Take 1 tablet by mouth daily.  . hydrocortisone (ANUSOL-HC) 2.5 % rectal cream Place 1  application rectally 2 (two) times daily.  PROAIR HFA 108 (90 Base) MCG/ACT inhaler INHALE 2 PUFFS BY MOUTH EVERY 4 HOURS ASNEEDED WHEEZING/ SHORTNESS OFBREATH  . zolpidem (AMBIEN) 10 MG tablet TAKE 1 TABLET BY MOUTH AT BEDTIME AS NEEDED SLEEP  . SUMAtriptan (IMITREX) 20 MG/ACT nasal spray Place 1 spray (20 mg total) into the nose once for 1 dose. May repeat in 2 hours if headache persists or recurs.   No current facility-administered medications on file prior to visit.    Allergies:  No Known Allergies  Social History:  Social History   Socioeconomic History  . Marital status: Single    Spouse name: Not on file  . Number of children: Not on file  . Years of education: Not on file  . Highest education level: Not on file  Occupational History  . Not on file  Tobacco Use  . Smoking status: Never Smoker  . Smokeless tobacco: Never Used  Substance and Sexual Activity  . Alcohol use: No    Alcohol/week: 0.0 standard drinks  . Drug use: No  . Sexual activity: Never  Other Topics  Concern  . Not on file  Social History Narrative  . Not on file   Social Determinants of Health   Financial Resource Strain:   . Difficulty of Paying Living Expenses: Not on file  Food Insecurity:   . Worried About Programme researcher, broadcasting/film/video in the Last Year: Not on file  . Ran Out of Food in the Last Year: Not on file  Transportation Needs:   . Lack of Transportation (Medical): Not on file  . Lack of Transportation (Non-Medical): Not on file  Physical Activity:   . Days of Exercise per Week: Not on file  . Minutes of Exercise per Session: Not on file  Stress:   . Feeling of Stress : Not on file  Social Connections:   . Frequency of Communication with Friends and Family: Not on file  . Frequency of Social Gatherings with Friends and Family: Not on file  . Attends Religious Services: Not on file  . Active Member of Clubs or Organizations: Not on file  . Attends Banker Meetings: Not on  file  . Marital Status: Not on file  Intimate Partner Violence:   . Fear of Current or Ex-Partner: Not on file  . Emotionally Abused: Not on file  . Physically Abused: Not on file  . Sexually Abused: Not on file   Social History   Tobacco Use  Smoking Status Never Smoker  Smokeless Tobacco Never Used   Social History   Substance and Sexual Activity  Alcohol Use No  . Alcohol/week: 0.0 standard drinks    Family History:  Family History  Problem Relation Age of Onset  . Depression Mother   . Migraines Sister   . Breast cancer Maternal Grandmother     Past medical history, surgical history, medications, allergies, family history and social history reviewed with patient today and changes made to appropriate areas of the chart.   Review of Systems - negative All other ROS negative except what is listed above and in the HPI.      Objective:    BP 107/68   Pulse 77   Temp 98.2 F (36.8 C) (Oral)   Ht 5' 3.7" (1.618 m)   Wt 178 lb 9.6 oz (81 kg)   LMP  (LMP Unknown)   SpO2 97%   BMI 30.95 kg/m   Wt Readings from Last 3 Encounters:  12/23/19 178 lb 9.6 oz (81 kg)  12/25/18 195 lb (88.5 kg)  06/18/18 189 lb 8 oz (86 kg)    Physical Exam Constitutional:      General: She is awake. She is not in acute distress.    Appearance: She is well-developed. She is not ill-appearing.  HENT:     Head: Normocephalic and atraumatic.     Right Ear: Hearing, tympanic membrane, ear canal and external ear normal. No drainage.     Left Ear: Hearing, tympanic membrane, ear canal and external ear normal. No drainage.     Nose: Nose normal.     Right Sinus: No maxillary sinus tenderness or frontal sinus tenderness.     Left Sinus: No maxillary sinus tenderness or frontal sinus tenderness.     Mouth/Throat:     Mouth: Mucous membranes are moist.     Pharynx: Oropharynx is clear. Uvula midline. No pharyngeal swelling, oropharyngeal exudate or posterior oropharyngeal erythema.  Eyes:      General: Lids are normal.        Right eye: No discharge.  Left eye: No discharge.     Extraocular Movements: Extraocular movements intact.     Conjunctiva/sclera: Conjunctivae normal.     Pupils: Pupils are equal, round, and reactive to light.     Visual Fields: Right eye visual fields normal and left eye visual fields normal.  Neck:     Thyroid: No thyromegaly.     Vascular: No carotid bruit.     Trachea: Trachea normal.  Cardiovascular:     Rate and Rhythm: Normal rate and regular rhythm.     Heart sounds: Normal heart sounds. No murmur. No gallop.   Pulmonary:     Effort: Pulmonary effort is normal. No accessory muscle usage or respiratory distress.     Breath sounds: Normal breath sounds.  Chest:     Breasts:        Right: Normal.        Left: Normal.  Abdominal:     General: Bowel sounds are normal.     Palpations: Abdomen is soft. There is no hepatomegaly or splenomegaly.     Tenderness: There is no abdominal tenderness.  Musculoskeletal:        General: Normal range of motion.     Cervical back: Normal range of motion and neck supple.     Right lower leg: No edema.     Left lower leg: No edema.  Lymphadenopathy:     Head:     Right side of head: No submental, submandibular, tonsillar, preauricular or posterior auricular adenopathy.     Left side of head: No submental, submandibular, tonsillar, preauricular or posterior auricular adenopathy.     Cervical: No cervical adenopathy.     Upper Body:     Right upper body: No supraclavicular, axillary or pectoral adenopathy.     Left upper body: No supraclavicular, axillary or pectoral adenopathy.  Skin:    General: Skin is warm and dry.     Capillary Refill: Capillary refill takes less than 2 seconds.     Findings: No rash.  Neurological:     Mental Status: She is alert and oriented to person, place, and time.     Cranial Nerves: Cranial nerves are intact.     Gait: Gait is intact.     Deep Tendon Reflexes:  Reflexes are normal and symmetric.     Reflex Scores:      Brachioradialis reflexes are 2+ on the right side and 2+ on the left side.      Patellar reflexes are 2+ on the right side and 2+ on the left side. Psychiatric:        Attention and Perception: Attention normal.        Mood and Affect: Mood normal.        Speech: Speech normal.        Behavior: Behavior normal. Behavior is cooperative.        Thought Content: Thought content normal.        Judgment: Judgment normal.     Results for orders placed or performed in visit on 12/25/18  CBC with Differential/Platelet  Result Value Ref Range   WBC 8.4 3.4 - 10.8 x10E3/uL   RBC 4.27 3.77 - 5.28 x10E6/uL   Hemoglobin 13.8 11.1 - 15.9 g/dL   Hematocrit 16.140.4 09.634.0 - 46.6 %   MCV 95 79 - 97 fL   MCH 32.3 26.6 - 33.0 pg   MCHC 34.2 31.5 - 35.7 g/dL   RDW 04.511.5 (L) 40.911.7 - 81.115.4 %   Platelets  267 150 - 450 x10E3/uL   Neutrophils 59 Not Estab. %   Lymphs 31 Not Estab. %   Monocytes 6 Not Estab. %   Eos 3 Not Estab. %   Basos 1 Not Estab. %   Neutrophils Absolute 5.1 1.4 - 7.0 x10E3/uL   Lymphocytes Absolute 2.6 0.7 - 3.1 x10E3/uL   Monocytes Absolute 0.5 0.1 - 0.9 x10E3/uL   EOS (ABSOLUTE) 0.2 0.0 - 0.4 x10E3/uL   Basophils Absolute 0.1 0.0 - 0.2 x10E3/uL   Immature Granulocytes 0 Not Estab. %   Immature Grans (Abs) 0.0 0.0 - 0.1 x10E3/uL  Comprehensive metabolic panel  Result Value Ref Range   Glucose 76 65 - 99 mg/dL   BUN 12 6 - 24 mg/dL   Creatinine, Ser 0.87 0.57 - 1.00 mg/dL   GFR calc non Af Amer 80 >59 mL/min/1.73   GFR calc Af Amer 92 >59 mL/min/1.73   BUN/Creatinine Ratio 14 9 - 23   Sodium 139 134 - 144 mmol/L   Potassium 4.5 3.5 - 5.2 mmol/L   Chloride 102 96 - 106 mmol/L   CO2 22 20 - 29 mmol/L   Calcium 9.7 8.7 - 10.2 mg/dL   Total Protein 7.6 6.0 - 8.5 g/dL   Albumin 4.3 3.8 - 4.8 g/dL   Globulin, Total 3.3 1.5 - 4.5 g/dL   Albumin/Globulin Ratio 1.3 1.2 - 2.2   Bilirubin Total 0.2 0.0 - 1.2 mg/dL   Alkaline  Phosphatase 65 39 - 117 IU/L   AST 11 0 - 40 IU/L   ALT 11 0 - 32 IU/L  TSH  Result Value Ref Range   TSH 1.880 0.450 - 4.500 uIU/mL  Lipid Panel w/o Chol/HDL Ratio  Result Value Ref Range   Cholesterol, Total 215 (H) 100 - 199 mg/dL   Triglycerides 143 0 - 149 mg/dL   HDL 65 >39 mg/dL   VLDL Cholesterol Cal 29 5 - 40 mg/dL   LDL Calculated 121 (H) 0 - 99 mg/dL  HgB A1c  Result Value Ref Range   Hgb A1c MFr Bld 5.5 4.8 - 5.6 %   Est. average glucose Bld gHb Est-mCnc 111 mg/dL  VITAMIN D 25 Hydroxy (Vit-D Deficiency, Fractures)  Result Value Ref Range   Vit D, 25-Hydroxy 75.0 30.0 - 100.0 ng/mL      Assessment & Plan:   Problem List Items Addressed This Visit      Cardiovascular and Mediastinum   Hypertension    Chronic, stable with BP below goal at home and in office today with weight loss present.  Continue current medication regimen and adjust as needed, will consider D/C of medication if BP remains stable and weight loss continues.  Labs today.  Refills sent.  Return in 6 months.      Relevant Medications   lisinopril (ZESTRIL) 5 MG tablet   Other Relevant Orders   Comprehensive metabolic panel   Lipid Panel w/o Chol/HDL Ratio   Migraines    Chronic, stable. Continue desogestrel-ethinyl estradiol continuous for hormonal maintenance. Continue use of sumatriptan as prescribed PRN for migraine headaches. Discussed the option of continuing continuous birth control up to age 9 for migraine prevention or allowing a one week break every month for menstrual cycle. Pt opted for continual birth control use at this time. Educated on the importance of annual mammograms with this option. Pt to notify provider with any changes in headaches, frequency, or severity.       Relevant Medications   lisinopril (ZESTRIL)  5 MG tablet   clonazePAM (KLONOPIN) 0.5 MG tablet     Respiratory   Asthma    Chronic, stable. Pt to continue current medication regimen as prescribed. Pt to notify  provider with change in symptoms or increased use/need of rescue inhaler. Follow-up in appx 6 months or sooner if needs arise.       Relevant Medications   Fluticasone-Salmeterol (ADVAIR DISKUS) 250-50 MCG/DOSE AEPB   montelukast (SINGULAIR) 10 MG tablet   Other Relevant Orders   CBC with Differential/Platelet     Endocrine   Hypothyroidism    Chronic, stable.  Continue current Levothyroxine dose and adjusted as needed.  Labs today.      Relevant Medications   levothyroxine (SYNTHROID) 50 MCG tablet   Other Relevant Orders   Thyroid Panel With TSH     Other   Insomnia    Chronic, stable with intermittent Ambien use.  Discussed sleep hygiene methods that may benefit her. Last fill on PMP review 12/04/2019, she reports not needing refills at this time.  Contract signed for controlled substance and UDS obtained.      Depression    Chronic, stable. To continue therapeutic exercises learned in therapy and PRN clonazepam as needed for acute events. To notify provider of increase in anxious mood or increased need for medication use.   Obtained UDS and contract today.  Follow-up in 6 months.      Generalized anxiety disorder    Chronic, stable with intermittent use of Klonopin.  Last fill on PMP review 06/26/19, she reports not needing refills at this time.  Obtained UDS and controlled substance contract today.  Return in 6 months.      Relevant Orders   Urine drugs of abuse scrn w alc, routine (Ref Lab)   Obesity    Praised for weight loss.  Recommend continued focus on healthy diet choices and regular physical activity (30 minutes 5 days a week).       Elevated LDL cholesterol level    Continue diet focus at this time and recheck lipid panel today.  ASCVD 0.9%.      Vitamin D deficiency    Reports history of low level, check today and continue daily supplement, adjust as needed.      Relevant Orders   VITAMIN D 25 Hydroxy (Vit-D Deficiency, Fractures)   Long-term current use  of benzodiazepine    Obtain UDS and controlled substance contract today.  Discussed at length risk of long term benzo use, at this time she uses appropriately, minimally.  Recommend continued minimal use.      Relevant Orders   Urine drugs of abuse scrn w alc, routine (Ref Lab)   Controlled substance agreement signed    Signed today 12/23/2019       Other Visit Diagnoses    Annual physical exam    -  Primary   Annual labs obtained to include CBC, CMP, TSH, lipid       Follow up plan: Return in about 6 months (around 06/21/2020) for HTN, MOOD.   LABORATORY TESTING:  - Pap smear: refuses  IMMUNIZATIONS:   - Tdap: Tetanus vaccination status reviewed: last tetanus booster within 10 years. - Influenza: Refused - Pneumovax: Not applicable - Prevnar: Not applicable - HPV: Not applicable - Zostavax vaccine: Not applicable  SCREENING: -Mammogram: Up to date  - Colonoscopy: Not applicable  - Bone Density: Not applicable  -Hearing Test: Not applicable  -Spirometry: Not applicable   PATIENT COUNSELING:  Advised to take 1 mg of folate supplement per day if capable of pregnancy.   Sexuality: Discussed sexually transmitted diseases, partner selection, use of condoms, avoidance of unintended pregnancy  and contraceptive alternatives.   Advised to avoid cigarette smoking.  I discussed with the patient that most people either abstain from alcohol or drink within safe limits (<=14/week and <=4 drinks/occasion for males, <=7/weeks and <= 3 drinks/occasion for females) and that the risk for alcohol disorders and other health effects rises proportionally with the number of drinks per week and how often a drinker exceeds daily limits.  Discussed cessation/primary prevention of drug use and availability of treatment for abuse.   Diet: Encouraged to adjust caloric intake to maintain  or achieve ideal body weight, to reduce intake of dietary saturated fat and total fat, to limit sodium intake  by avoiding high sodium foods and not adding table salt, and to maintain adequate dietary potassium and calcium preferably from fresh fruits, vegetables, and low-fat dairy products.    stressed the importance of regular exercise  Injury prevention: Discussed safety belts, safety helmets, smoke detector, smoking near bedding or upholstery.   Dental health: Discussed importance of regular tooth brushing, flossing, and dental visits.    NEXT PREVENTATIVE PHYSICAL DUE IN 1 YEAR. Return in about 6 months (around 06/21/2020) for HTN, MOOD.

## 2019-12-24 LAB — LIPID PANEL W/O CHOL/HDL RATIO
Cholesterol, Total: 195 mg/dL (ref 100–199)
HDL: 61 mg/dL (ref >39)
LDL Chol Calc (NIH): 108 mg/dL — ABNORMAL HIGH (ref 0–99)
Triglycerides: 152 mg/dL — ABNORMAL HIGH (ref 0–149)
VLDL Cholesterol Cal: 26 mg/dL (ref 5–40)

## 2019-12-24 LAB — COMPREHENSIVE METABOLIC PANEL
ALT: 9 IU/L (ref 0–32)
AST: 13 IU/L (ref 0–40)
Albumin/Globulin Ratio: 1.4 (ref 1.2–2.2)
Albumin: 4.2 g/dL (ref 3.8–4.8)
Alkaline Phosphatase: 72 IU/L (ref 39–117)
BUN/Creatinine Ratio: 14 (ref 9–23)
BUN: 11 mg/dL (ref 6–24)
Bilirubin Total: 0.2 mg/dL (ref 0.0–1.2)
CO2: 21 mmol/L (ref 20–29)
Calcium: 9.7 mg/dL (ref 8.7–10.2)
Chloride: 101 mmol/L (ref 96–106)
Creatinine, Ser: 0.77 mg/dL (ref 0.57–1.00)
GFR calc Af Amer: 106 mL/min/{1.73_m2} (ref >59)
GFR calc non Af Amer: 92 mL/min/{1.73_m2} (ref >59)
Globulin, Total: 3.1 g/dL (ref 1.5–4.5)
Glucose: 70 mg/dL (ref 65–99)
Potassium: 4.8 mmol/L (ref 3.5–5.2)
Sodium: 139 mmol/L (ref 134–144)
Total Protein: 7.3 g/dL (ref 6.0–8.5)

## 2019-12-24 LAB — CBC WITH DIFFERENTIAL/PLATELET
Basophils Absolute: 0 10*3/uL (ref 0.0–0.2)
Basos: 0 %
EOS (ABSOLUTE): 0.2 10*3/uL (ref 0.0–0.4)
Eos: 3 %
Hematocrit: 41.7 % (ref 34.0–46.6)
Hemoglobin: 13.9 g/dL (ref 11.1–15.9)
Immature Grans (Abs): 0 10*3/uL (ref 0.0–0.1)
Immature Granulocytes: 0 %
Lymphocytes Absolute: 2.4 10*3/uL (ref 0.7–3.1)
Lymphs: 27 %
MCH: 32.6 pg (ref 26.6–33.0)
MCHC: 33.3 g/dL (ref 31.5–35.7)
MCV: 98 fL — ABNORMAL HIGH (ref 79–97)
Monocytes Absolute: 0.4 10*3/uL (ref 0.1–0.9)
Monocytes: 5 %
Neutrophils Absolute: 6 10*3/uL (ref 1.4–7.0)
Neutrophils: 65 %
Platelets: 272 10*3/uL (ref 150–450)
RBC: 4.26 x10E6/uL (ref 3.77–5.28)
RDW: 11.6 % — ABNORMAL LOW (ref 11.7–15.4)
WBC: 9.1 10*3/uL (ref 3.4–10.8)

## 2019-12-24 LAB — THYROID PANEL WITH TSH
Free Thyroxine Index: 1.9 (ref 1.2–4.9)
T3 Uptake Ratio: 17 % — ABNORMAL LOW (ref 24–39)
T4, Total: 11.2 ug/dL (ref 4.5–12.0)
TSH: 1.23 u[IU]/mL (ref 0.450–4.500)

## 2019-12-24 LAB — URINE DRUGS OF ABUSE SCREEN W ALC, ROUTINE (REF LAB)
Amphetamines, Urine: NEGATIVE ng/mL
Barbiturate Quant, Ur: NEGATIVE ng/mL
Benzodiazepine Quant, Ur: NEGATIVE ng/mL
Cannabinoid Quant, Ur: NEGATIVE ng/mL
Cocaine (Metab.): NEGATIVE ng/mL
Ethanol, Urine: NEGATIVE %
Methadone Screen, Urine: NEGATIVE ng/mL
Opiate Quant, Ur: NEGATIVE ng/mL
PCP Quant, Ur: NEGATIVE ng/mL
Propoxyphene: NEGATIVE ng/mL

## 2019-12-24 LAB — VITAMIN D 25 HYDROXY (VIT D DEFICIENCY, FRACTURES): Vit D, 25-Hydroxy: 83.1 ng/mL (ref 30.0–100.0)

## 2019-12-24 NOTE — Progress Notes (Signed)
Please let Brailey know via call, if no answer may send letter, the following: Labs have returned - thyroid level remains in good range, can continue current Levothyroxine dose - Cholesterol levels remain elevated, but risk score low.  Continue diet focus. - Vitamin D level is in normal range - Kidney and liver function look good  Overall good labs and no changes to medications needed.  Have a great day!!

## 2019-12-30 ENCOUNTER — Ambulatory Visit: Payer: Managed Care, Other (non HMO)

## 2019-12-30 ENCOUNTER — Other Ambulatory Visit: Payer: Self-pay

## 2020-05-03 ENCOUNTER — Other Ambulatory Visit: Payer: Self-pay | Admitting: Nurse Practitioner

## 2020-05-20 ENCOUNTER — Other Ambulatory Visit: Payer: Self-pay | Admitting: Nurse Practitioner

## 2020-05-20 MED ORDER — SUMATRIPTAN 20 MG/ACT NA SOLN: 20.0000 mg | Freq: Once | NASAL | 3 refills | Status: DC

## 2020-05-20 NOTE — Telephone Encounter (Signed)
Medication Refill - Medication: Imitrex   Has the patient contacted their pharmacy? Yes.   (Agent: If no, request that the patient contact the pharmacy for the refill.) (Agent: If yes, when and what did the pharmacy advise?)  Preferred Pharmacy (with phone number or street name):  [facility] - [place], [place] - [address]  [address] [place] [address]  Phone: [phone] Fax: [phone]  Hours: Not open 24 hours     Agent: Please be advised that RX refills may take up to 3 business days. We ask that you follow-up with your pharmacy.

## 2020-06-25 ENCOUNTER — Ambulatory Visit (INDEPENDENT_AMBULATORY_CARE_PROVIDER_SITE_OTHER): Payer: Managed Care, Other (non HMO) | Admitting: Nurse Practitioner

## 2020-06-25 ENCOUNTER — Other Ambulatory Visit: Payer: Self-pay

## 2020-06-25 ENCOUNTER — Encounter: Payer: Self-pay | Admitting: Nurse Practitioner

## 2020-06-25 VITALS — BP 112/69 | HR 73 | Temp 98.1°F | Wt 176.6 lb

## 2020-06-25 DIAGNOSIS — F330 Major depressive disorder, recurrent, mild: Secondary | ICD-10-CM

## 2020-06-25 DIAGNOSIS — E7800 Pure hypercholesterolemia, unspecified: Secondary | ICD-10-CM | POA: Diagnosis not present

## 2020-06-25 DIAGNOSIS — F411 Generalized anxiety disorder: Secondary | ICD-10-CM

## 2020-06-25 DIAGNOSIS — Z79899 Other long term (current) drug therapy: Secondary | ICD-10-CM

## 2020-06-25 DIAGNOSIS — I10 Essential (primary) hypertension: Secondary | ICD-10-CM | POA: Diagnosis not present

## 2020-06-25 DIAGNOSIS — E039 Hypothyroidism, unspecified: Secondary | ICD-10-CM

## 2020-06-25 DIAGNOSIS — J4520 Mild intermittent asthma, uncomplicated: Secondary | ICD-10-CM

## 2020-06-25 DIAGNOSIS — E6609 Other obesity due to excess calories: Secondary | ICD-10-CM

## 2020-06-25 DIAGNOSIS — F5101 Primary insomnia: Secondary | ICD-10-CM

## 2020-06-25 DIAGNOSIS — Z6830 Body mass index (BMI) 30.0-30.9, adult: Secondary | ICD-10-CM

## 2020-06-25 MED ORDER — CLONAZEPAM 0.5 MG PO TABS: ORAL_TABLET | ORAL | 2 refills | Status: DC

## 2020-06-25 MED ORDER — ZOLPIDEM TARTRATE 10 MG PO TABS: ORAL_TABLET | ORAL | 1 refills | Status: DC

## 2020-06-25 NOTE — Assessment & Plan Note (Signed)
Obtain UDS and controlled substance contract last visit.  Discussed at length risk of long term benzo use, at this time she uses appropriately, minimally.  Recommend continued minimal use.

## 2020-06-25 NOTE — Progress Notes (Signed)
BP 112/69   Pulse 73   Temp 98.1 F (36.7 C) (Oral)   Wt 176 lb 9.6 oz (80.1 kg)   SpO2 98%   BMI 30.60 kg/m    Subjective:    Patient ID: Laurie Martinez, female    DOB: Jun 29, 1971, 49 y.o.   MRN: [id number]  HPI: Laurie Martinez is a 49 y.o. female  Chief Complaint  Patient presents with  . Depression  . Hypertension   HYPERTENSION/HLD Continues Lisinopril 5 MG daily.Has lost almost 20 pounds making better food choices, has given up soda.   Hypertension status:controlled Satisfied with current treatment?yes Duration of hypertension:chronic BP monitoring frequency:weekly BP range:117 to 120/80's BP medication side effects:no Medication compliance:good compliance Aspirin:no Recurrent headaches:history of chronic migraine headaches, not having as often due to BCP Visual changes:no Palpitations:no Dyspnea:no Chest pain:no Lower extremity edema:no Dizzy/lightheaded:no The 10-year ASCVD risk score Denman Laurie DC Jr., et al., 2013) is: 0.7%   Values used to calculate the score:     Age: 45 years     Sex: Female     Is Non-Hispanic African American: No     Diabetic: No     Tobacco smoker: No     Systolic Blood Pressure: 112 mmHg     Is BP treated: No     HDL Cholesterol: 61 mg/dL     Total Cholesterol: 195 mg/dL   HYPOTHYROIDISM Last TSH was 1.230 February 2021. Continues on Levothyroxine 50 MCG. Thyroid control status:controlled Satisfied with current treatment?yes Medication side effects:no Medication compliance:good compliance Recent dose adjustment:no Fatigue:no Cold intolerance:no Heat intolerance:no Weight gain:no Weight loss:yes -- expected Constipation:yes- chronic issue- no change in baseline -- uses Miralax Diarrhea/loose stools:no Palpitations:no Lower extremity edema:no Anxiety/depressed mood:yes- chronic issue- no change in baseline  ASTHMA Continues Singulair and Advair daily and Ventolin PRN.Her asthma is  seasonal and exercise induced. Asthma status:controlled Satisfied with current treatment?:yes Albuterol/rescue inhaler frequency:none Dyspnea frequency:rare Wheezing frequency:rare Cough frequency:rare Nocturnal symptom frequency:none Limitation of activity:none Current upper respiratory symptoms:no Triggers: seasonal allergies in the spring and fall Visits to ER or Urgent Care in past year:no Pneumovax:Up to Date Influenza:Not up to Date- patient declines the flu vaccine  DEPRESSION, ANXIETY, &INSOMNIA Continues Ambien and Klonopin.Pt reports satisfaction with current treatment options and wishes to continue.Pt is awareof risks ofbenzomedication use to include increased sedation, respiratory suppression, falls, dependence and cardiovascular events. Ptwould like to continue treatment as benefit determined to outweigh risk.At baselineKlonopin use less than once a month as needed for severe symptoms, more in winter.Reports difficulty sleeping most nights, but reserves use ofAmbien for weekendsand when not working.  Continuesuse of therapeutic exercises learned in therapy to decrease depression and anxiety symptoms. Reports depression symptoms are worse in the winter, but are well controlled with this method.On review PDMP last Ambien fill 12/31/19 and Klonopin 01/21/2020. Mood status:controlled Satisfied with current treatment?:yes Symptom severity:moderate Duration of current treatment :chronic Side effects:no Medication compliance:good compliance Psychotherapy/counseling:yesin the past Previous psychiatric medications:pt reports "several" medications > 5 years ago, but cannot recall the names Depressed mood:yes Anxious mood:yes Anhedonia:no Significant weight loss or gain:no Insomnia:yes Fatigue:yes Feelings of worthlessness or guilt:no Impaired concentration/indecisiveness:ocassional Suicidal ideations:no Hopelessness:no Crying  spells:no Depression screen [facility] 2/9 06/25/2020 12/23/2019 10/01/2019 06/26/2019 12/25/2018  Decreased Interest 1 1 1 1  0  Down, Depressed, Hopeless 1 1 1  0 0  PHQ - 2 Score 2 2 2 1  0  Altered sleeping 3 3 3 3 2   Tired, decreased energy 3 3 3 3  2  Change in appetite 1 1 0 0 0  Feeling bad or failure about yourself  0 0 0 0 0  Trouble concentrating 3 1 1 2 2   Moving slowly or fidgety/restless 0 0 0 0 0  Suicidal thoughts 0 0 0 0 0  PHQ-9 Score 12 10 9 9 6   Difficult doing work/chores - Not difficult at all Not difficult at all Somewhat difficult Somewhat difficult  Some recent data might be hidden    Relevant past medical, surgical, family and social history reviewed and updated as indicated. Interim medical history since our last visit reviewed. Allergies and medications reviewed and updated.  Review of Systems  Constitutional: Negative for activity change, appetite change, diaphoresis, fatigue and fever.  Respiratory: Negative for cough, chest tightness and shortness of breath.   Cardiovascular: Negative for chest pain, palpitations and leg swelling.  Gastrointestinal: Negative.   Endocrine: Negative for cold intolerance and heat intolerance.  Neurological: Negative.   Psychiatric/Behavioral: Positive for sleep disturbance. Negative for decreased concentration, self-injury and suicidal ideas. The patient is not nervous/anxious.     Per HPI unless specifically indicated above     Objective:    BP 112/69   Pulse 73   Temp 98.1 F (36.7 C) (Oral)   Wt 176 lb 9.6 oz (80.1 kg)   SpO2 98%   BMI 30.60 kg/m   Wt Readings from Last 3 Encounters:  06/25/20 176 lb 9.6 oz (80.1 kg)  12/23/19 178 lb 9.6 oz (81 kg)  12/25/18 195 lb (88.5 kg)    Physical Exam Vitals and nursing note reviewed.  Constitutional:      General: She is awake. She is not in acute distress.    Appearance: She is well-developed and well-groomed. She is obese. She is not ill-appearing.  HENT:     Head:  Normocephalic.     Right Ear: Hearing normal.     Left Ear: Hearing normal.  Eyes:     General: Lids are normal.        Right eye: No discharge.        Left eye: No discharge.     Conjunctiva/sclera: Conjunctivae normal.     Pupils: Pupils are equal, round, and reactive to light.  Neck:     Thyroid: No thyromegaly.     Vascular: No carotid bruit.  Cardiovascular:     Rate and Rhythm: Normal rate and regular rhythm.     Heart sounds: Normal heart sounds. No murmur heard.  No gallop.   Pulmonary:     Effort: Pulmonary effort is normal. No accessory muscle usage or respiratory distress.     Breath sounds: Normal breath sounds.  Abdominal:     General: Bowel sounds are normal.     Palpations: Abdomen is soft.  Musculoskeletal:     Cervical back: Normal range of motion and neck supple.     Right lower leg: No edema.     Left lower leg: No edema.  Skin:    General: Skin is warm and dry.  Neurological:     Mental Status: She is alert and oriented to person, place, and time.  Psychiatric:        Attention and Perception: Attention normal.        Mood and Affect: Mood normal.        Speech: Speech normal.        Behavior: Behavior normal. Behavior is cooperative.        Thought Content: Thought content normal.  Results for orders placed or performed in visit on 12/23/19  CBC with Differential/Platelet  Result Value Ref Range   WBC 9.1 3.4 - 10.8 x10E3/uL   RBC 4.26 3.77 - 5.28 x10E6/uL   Hemoglobin 13.9 11.1 - 15.9 g/dL   Hematocrit 72.541.7 36.634.0 - 46.6 %   MCV 98 (H) 79 - 97 fL   MCH 32.6 26.6 - 33.0 pg   MCHC 33.3 31 - 35 g/dL   RDW 44.011.6 (L) 34.711.7 - 42.515.4 %   Platelets 272 150 - 450 x10E3/uL   Neutrophils 65 Not Estab. %   Lymphs 27 Not Estab. %   Monocytes 5 Not Estab. %   Eos 3 Not Estab. %   Basos 0 Not Estab. %   Neutrophils Absolute 6.0 1 - 7 x10E3/uL   Lymphocytes Absolute 2.4 0 - 3 x10E3/uL   Monocytes Absolute 0.4 0 - 0 x10E3/uL   EOS (ABSOLUTE) 0.2 0.0 - 0.4  x10E3/uL   Basophils Absolute 0.0 0 - 0 x10E3/uL   Immature Granulocytes 0 Not Estab. %   Immature Grans (Abs) 0.0 0.0 - 0.1 x10E3/uL  Comprehensive metabolic panel  Result Value Ref Range   Glucose 70 65 - 99 mg/dL   BUN 11 6 - 24 mg/dL   Creatinine, Ser 9.560.77 0.57 - 1.00 mg/dL   GFR calc non Af Amer 92 >59 mL/min/1.73   GFR calc Af Amer 106 >59 mL/min/1.73   BUN/Creatinine Ratio 14 9 - 23   Sodium 139 134 - 144 mmol/L   Potassium 4.8 3.5 - 5.2 mmol/L   Chloride 101 96 - 106 mmol/L   CO2 21 20 - 29 mmol/L   Calcium 9.7 8.7 - 10.2 mg/dL   Total Protein 7.3 6.0 - 8.5 g/dL   Albumin 4.2 3.8 - 4.8 g/dL   Globulin, Total 3.1 1.5 - 4.5 g/dL   Albumin/Globulin Ratio 1.4 1.2 - 2.2   Bilirubin Total 0.2 0.0 - 1.2 mg/dL   Alkaline Phosphatase 72 39 - 117 IU/L   AST 13 0 - 40 IU/L   ALT 9 0 - 32 IU/L  Lipid Panel w/o Chol/HDL Ratio  Result Value Ref Range   Cholesterol, Total 195 100 - 199 mg/dL   Triglycerides 387152 (H) 0 - 149 mg/dL   HDL 61 >56>39 mg/dL   VLDL Cholesterol Cal 26 5 - 40 mg/dL   LDL Chol Calc (NIH) 433108 (H) 0 - 99 mg/dL  VITAMIN D 25 Hydroxy (Vit-D Deficiency, Fractures)  Result Value Ref Range   Vit D, 25-Hydroxy 83.1 30.0 - 100.0 ng/mL  Thyroid Panel With TSH  Result Value Ref Range   TSH 1.230 0.450 - 4.500 uIU/mL   T4, Total 11.2 4.5 - 12.0 ug/dL   T3 Uptake Ratio 17 (L) 24 - 39 %   Free Thyroxine Index 1.9 1.2 - 4.9  Urine drugs of abuse scrn w alc, routine (Ref Lab)  Result Value Ref Range   Amphetamines, Urine Negative Cutoff=1000 ng/mL   Barbiturate Quant, Ur Negative Cutoff=300 ng/mL   Benzodiazepine Quant, Ur Negative Cutoff=300 ng/mL   Cannabinoid Quant, Ur Negative Cutoff=50 ng/mL   Cocaine (Metab.) Negative Cutoff=300 ng/mL   Opiate Quant, Ur Negative Cutoff=300 ng/mL   PCP Quant, Ur Negative Cutoff=25 ng/mL   Methadone Screen, Urine Negative Cutoff=300 ng/mL   Propoxyphene Negative Cutoff=300 ng/mL   Ethanol, Urine Negative Cutoff=0.020 %        Assessment & Plan:   Problem List Items Addressed This Visit  Cardiovascular and Mediastinum   Hypertension    Chronic, stable with BP below goal at home and in office today with weight loss present.   D/C Lisinopril at this time and recommend she continue to monitor BP at home and notify provider if consistent above 130/80, will restart medication if this happens.  She agrees with plan.  DASH diet focus.  Labs today.  Return in 6 months for physical.      Relevant Orders   Basic metabolic panel     Respiratory   Asthma    Chronic, stable. Pt to continue current medication regimen as prescribed. Pt to notify provider with change in symptoms or increased use/need of rescue inhaler. Follow-up in appx 6 months or sooner if needs arise.         Endocrine   Hypothyroidism    Chronic, stable.  Continue current Levothyroxine dose and adjusted as needed.  Labs annually, next visit.        Other   Insomnia    Chronic, stable with intermittent Ambien use.  Discussed sleep hygiene methods that may benefit her. Last fill on PMP review 12/31/19, uses minimally.  Contract signed for controlled substance and UDS obtained last visit.  Refills sent today.  Return in 6 months for physical.      Depression - Primary    Chronic, stable. To continue therapeutic exercises learned in therapy and PRN clonazepam as needed for acute events. To notify provider of increase in anxious mood or increased need for medication use.  Refills sent.  Obtained UDS and contract last visit.  Follow-up in 6 months.      Generalized anxiety disorder    Chronic, stable with intermittent use of Klonopin.  Last fill on PMP review 01/21/20, she refills sent today.  Obtained UDS and controlled substance contract last visit.  Return in 6 months.      Obesity    Praised for 20 pounds weight loss.  Recommended eating smaller high protein, low fat meals more frequently and exercising 30 mins a day 5 times a week with a goal  of 10-15lb weight loss in the next 3 months. Patient voiced their understanding and motivation to adhere to these recommendations.       Elevated LDL cholesterol level    Continue diet focus at this time and recheck lipid panel today.  ASCVD 0.7%.      Relevant Orders   Lipid Panel w/o Chol/HDL Ratio   Long-term current use of benzodiazepine    Obtain UDS and controlled substance contract last visit.  Discussed at length risk of long term benzo use, at this time she uses appropriately, minimally.  Recommend continued minimal use.          Follow up plan: Return in about 6 months (around 12/26/2020) for Annual physical.

## 2020-06-25 NOTE — Patient Instructions (Signed)

## 2020-06-25 NOTE — Assessment & Plan Note (Signed)
Chronic, stable.  Continue current Levothyroxine dose and adjusted as needed.  Labs annually, next visit.

## 2020-06-25 NOTE — Assessment & Plan Note (Addendum)
Chronic, stable. To continue therapeutic exercises learned in therapy and PRN clonazepam as needed for acute events. To notify provider of increase in anxious mood or increased need for medication use.  Refills sent.  Obtained UDS and contract last visit.  Follow-up in 6 months.

## 2020-06-25 NOTE — Assessment & Plan Note (Signed)
Continue diet focus at this time and recheck lipid panel today.  ASCVD 0.7%.

## 2020-06-25 NOTE — Assessment & Plan Note (Signed)
Chronic, stable with intermittent use of Klonopin.  Last fill on PMP review 01/21/20, she refills sent today.  Obtained UDS and controlled substance contract last visit.  Return in 6 months.

## 2020-06-25 NOTE — Assessment & Plan Note (Signed)
Chronic, stable. Pt to continue current medication regimen as prescribed. Pt to notify provider with change in symptoms or increased use/need of rescue inhaler. Follow-up in appx 6 months or sooner if needs arise.  

## 2020-06-25 NOTE — Assessment & Plan Note (Signed)
Chronic, stable with intermittent Ambien use.  Discussed sleep hygiene methods that may benefit her. Last fill on PMP review 12/31/19, uses minimally.  Contract signed for controlled substance and UDS obtained last visit.  Refills sent today.  Return in 6 months for physical.

## 2020-06-25 NOTE — Assessment & Plan Note (Signed)
Chronic, stable with BP below goal at home and in office today with weight loss present.   D/C Lisinopril at this time and recommend she continue to monitor BP at home and notify provider if consistent above 130/80, will restart medication if this happens.  She agrees with plan.  DASH diet focus.  Labs today.  Return in 6 months for physical.

## 2020-06-25 NOTE — Assessment & Plan Note (Signed)
Praised for 20 pounds weight loss. Recommended eating smaller high protein, low fat meals more frequently and exercising 30 mins a day 5 times a week with a goal of 10-15lb weight loss in the next 3 months. Patient voiced their understanding and motivation to adhere to these recommendations.  

## 2020-06-26 LAB — BASIC METABOLIC PANEL
BUN/Creatinine Ratio: 15 (ref 9–23)
BUN: 11 mg/dL (ref 6–24)
CO2: 24 mmol/L (ref 20–29)
Calcium: 9.4 mg/dL (ref 8.7–10.2)
Chloride: 104 mmol/L (ref 96–106)
Creatinine, Ser: 0.72 mg/dL (ref 0.57–1.00)
GFR calc Af Amer: 115 mL/min/{1.73_m2} (ref >59)
GFR calc non Af Amer: 99 mL/min/{1.73_m2} (ref >59)
Glucose: 71 mg/dL (ref 65–99)
Potassium: 5.1 mmol/L (ref 3.5–5.2)
Sodium: 140 mmol/L (ref 134–144)

## 2020-06-26 LAB — LIPID PANEL W/O CHOL/HDL RATIO
Cholesterol, Total: 204 mg/dL — ABNORMAL HIGH (ref 100–199)
HDL: 65 mg/dL (ref >39)
LDL Chol Calc (NIH): 116 mg/dL — ABNORMAL HIGH (ref 0–99)
Triglycerides: 129 mg/dL (ref 0–149)
VLDL Cholesterol Cal: 23 mg/dL (ref 5–40)

## 2020-06-27 ENCOUNTER — Encounter: Payer: Self-pay | Admitting: Nurse Practitioner

## 2020-06-27 NOTE — Progress Notes (Signed)
Lab letter sent out

## 2020-07-15 ENCOUNTER — Other Ambulatory Visit: Payer: Self-pay | Admitting: Nurse Practitioner

## 2020-07-15 DIAGNOSIS — Z1231 Encounter for screening mammogram for malignant neoplasm of breast: Secondary | ICD-10-CM

## 2020-08-03 ENCOUNTER — Ambulatory Visit
Admission: RE | Admit: 2020-08-03 | Discharge: 2020-08-03 | Disposition: A | Discharge status: Home / Self Care | Payer: Managed Care, Other (non HMO) | Source: Ambulatory Visit | Attending: Nurse Practitioner | Admitting: Nurse Practitioner

## 2020-08-03 ENCOUNTER — Other Ambulatory Visit: Payer: Self-pay

## 2020-08-03 DIAGNOSIS — Z1231 Encounter for screening mammogram for malignant neoplasm of breast: Secondary | ICD-10-CM | POA: Insufficient documentation

## 2020-10-25 ENCOUNTER — Other Ambulatory Visit: Payer: Self-pay | Admitting: Nurse Practitioner

## 2020-12-06 ENCOUNTER — Other Ambulatory Visit: Payer: Self-pay | Admitting: Nurse Practitioner

## 2020-12-07 NOTE — Telephone Encounter (Signed)
Requested Prescriptions  Pending Prescriptions Disp Refills  . montelukast (SINGULAIR) 10 MG tablet [Pharmacy Med Name: MONTELUKAST SODIUM 10 MG TAB] 90 tablet 0    Sig: TAKE 1 TABLET BY MOUTH ONCE DAILY     Pulmonology:  Leukotriene Inhibitors Passed - 12/06/2020  5:06 PM      Passed - Valid encounter within last 12 months    Recent Outpatient Visits          5 months ago Mild episode of recurrent major depressive disorder (HCC)   [facility] Cannady, Dorie Rank, NP   11 months ago Annual physical exam   [facility] [place], Corrie Dandy T, NP   1 year ago Depression, unspecified depression type   [facility] [place], Dorie Rank, NP   1 year ago Essential hypertension   [facility] [place], Dorie Rank, NP   1 year ago Annual physical exam   [facility] [place], Dorie Rank, NP      Future Appointments            In 3 weeks Cannady, Dorie Rank, NP [employer]

## 2020-12-30 ENCOUNTER — Encounter: Payer: Self-pay | Admitting: Nurse Practitioner

## 2020-12-30 ENCOUNTER — Other Ambulatory Visit: Payer: Self-pay

## 2020-12-30 ENCOUNTER — Ambulatory Visit (INDEPENDENT_AMBULATORY_CARE_PROVIDER_SITE_OTHER): Payer: Managed Care, Other (non HMO) | Admitting: Nurse Practitioner

## 2020-12-30 VITALS — BP 118/74 | HR 85 | Ht 64.0 in | Wt 190.2 lb

## 2020-12-30 DIAGNOSIS — Z0000 Encounter for general adult medical examination without abnormal findings: Secondary | ICD-10-CM

## 2020-12-30 DIAGNOSIS — I10 Essential (primary) hypertension: Secondary | ICD-10-CM | POA: Diagnosis not present

## 2020-12-30 DIAGNOSIS — Z79899 Other long term (current) drug therapy: Secondary | ICD-10-CM

## 2020-12-30 DIAGNOSIS — E039 Hypothyroidism, unspecified: Secondary | ICD-10-CM

## 2020-12-30 DIAGNOSIS — F5101 Primary insomnia: Secondary | ICD-10-CM

## 2020-12-30 DIAGNOSIS — F411 Generalized anxiety disorder: Secondary | ICD-10-CM

## 2020-12-30 DIAGNOSIS — Z1159 Encounter for screening for other viral diseases: Secondary | ICD-10-CM

## 2020-12-30 DIAGNOSIS — J4520 Mild intermittent asthma, uncomplicated: Secondary | ICD-10-CM

## 2020-12-30 DIAGNOSIS — R809 Proteinuria, unspecified: Secondary | ICD-10-CM | POA: Diagnosis not present

## 2020-12-30 DIAGNOSIS — E559 Vitamin D deficiency, unspecified: Secondary | ICD-10-CM

## 2020-12-30 DIAGNOSIS — G43909 Migraine, unspecified, not intractable, without status migrainosus: Secondary | ICD-10-CM

## 2020-12-30 DIAGNOSIS — Z8616 Personal history of COVID-19: Secondary | ICD-10-CM | POA: Insufficient documentation

## 2020-12-30 DIAGNOSIS — F330 Major depressive disorder, recurrent, mild: Secondary | ICD-10-CM

## 2020-12-30 DIAGNOSIS — E7800 Pure hypercholesterolemia, unspecified: Secondary | ICD-10-CM

## 2020-12-30 LAB — MICROALBUMIN, URINE WAIVED
Creatinine, Urine Waived: 300 mg/dL (ref 10–300)
Microalb, Ur Waived: 80 mg/L — ABNORMAL HIGH (ref 0–19)

## 2020-12-30 MED ORDER — ZOLPIDEM TARTRATE 10 MG PO TABS: ORAL_TABLET | ORAL | 1 refills | Status: DC

## 2020-12-30 MED ORDER — FLUTICASONE-SALMETEROL 250-50 MCG/DOSE IN AEPB: 1.0000 | INHALATION_SPRAY | Freq: Two times a day (BID) | RESPIRATORY_TRACT | 3 refills | Status: DC

## 2020-12-30 MED ORDER — CLONAZEPAM 0.5 MG PO TABS: ORAL_TABLET | ORAL | 2 refills | Status: DC

## 2020-12-30 MED ORDER — LISINOPRIL 5 MG PO TABS: 5.0000 mg | ORAL_TABLET | Freq: Every day | ORAL | 4 refills | Status: DC

## 2020-12-30 NOTE — Progress Notes (Addendum)
BP 118/74 (BP Location: Left Arm)   Pulse 85   Ht  (1.626 m)   Wt 190 lb 3.2 oz (86.3 kg)   SpO2 98%   BMI 32.65 kg/m    Subjective:    Patient ID: Laurie Martinez, female    DOB: [birth year], 50 y.o.   MRN: [id number]  HPI: Laurie Martinez is a 50 y.o. female presenting on 12/30/2020 for comprehensive medical examination. Current medical complaints include:none  She currently lives with: sister Menopausal Symptoms: no   HYPERTENSION/HLD Continues Lisinopril  daily -- started taking back in the last little bit -- has not been eating well due to Covid Hypertension status: controlled  Satisfied with current treatment? yes Duration of hypertension: chronic BP monitoring frequency:  weekly BP range: 130-140/80 range at home BP medication side effects:  no Medication compliance: good compliance Aspirin: no Recurrent headaches: history of chronic migraine headaches, not having as often due to BCP Visual changes: no Palpitations: no Dyspnea: no Chest pain: no Lower extremity edema: no Dizzy/lightheaded: no  The 10-year ASCVD risk score [name], et al., 2013) is: 1.1%   Values used to calculate the score:     Age: 83 years     Sex: Female     Is Non-Hispanic African American: No     Diabetic: No     Tobacco smoker: No     Systolic Blood Pressure: 118 mmHg     Is BP treated: Yes     HDL Cholesterol: 65 mg/dL     Total Cholesterol: 204 mg/dL  HYPOTHYROIDISM Last TSH was 1.230 in February 2021.  Continues on Levothyroxine 50 MCG. Thyroid control status:controlled Satisfied with current treatment? yes Medication side effects: no Medication compliance: good compliance Recent dose adjustment:no Fatigue: no Cold intolerance: no Heat intolerance: no Weight gain: no Weight loss: yes -- expected Constipation: yes- chronic issue- no change in baseline -- uses Miralax Diarrhea/loose stools: no Palpitations: no Lower extremity edema: no Anxiety/depressed mood:  yes - chronic issue- no change in baseline  ASTHMA Continues Singulair and Advair daily and Ventolin PRN. Her asthma is seasonal and exercise induced. Asthma status: controlled Satisfied with current treatment?: yes Albuterol/rescue inhaler frequency: no use since Christmas Dyspnea frequency: rare Wheezing frequency: rare Cough frequency: rare Nocturnal symptom frequency: none Limitation of activity: none Current upper respiratory symptoms: no Triggers: seasonal allergies in the spring and fall  Visits to ER or Urgent Care in past year: no Pneumovax: Up to Date Influenza: Not up to Date - patient declines the flu vaccine  DEPRESSION, ANXIETY, & INSOMNIA Continues Ambien and Klonopin.Pt reports satisfaction with current treatment options and wishes to continue.Pt is awareof risks ofbenzomedication use to include increased sedation, respiratory suppression, falls, dependence and cardiovascular events. Ptwould like to continue treatment as benefit determined to outweigh risk.At baselineKlonopin use less than once a month as needed for severe symptoms, has been using a little more lately as winter months are hardest for anxiety (not more than 5 times a month).Reports difficulty sleeping most nights, but reserves use ofAmbien for weekends and when not working.  She does report getting sick with Covid in January and still feels down from that -- fatigued and drained.  Continuesuse of therapeutic exercises learned in therapy to decrease depression and anxiety symptoms. Reports depression symptoms are worse in the winter, but are well controlled with this method.On review PDMP last Ambien fill 06/25/2020 and Klonopin 06/25/2020.   Mood status: controlled Satisfied with current treatment?:  yes Symptom severity: moderate  Duration of current treatment : chronic Side effects: no Medication compliance: good compliance Psychotherapy/counseling: yes in the past Previous psychiatric  medications: pt reports "several" medications > 5 years ago, but cannot recall the names Depressed mood: yes Anxious mood:yes Anhedonia: no Significant weight loss or gain: no Insomnia: yes Fatigue: yes Feelings of worthlessness or guilt: no Impaired concentration/indecisiveness: ocassional Suicidal ideations: no Hopelessness: no Crying spells: no  Depression Screen done today and results listed below:  Depression screen [facility] [place] 2/9 12/30/2020 06/25/2020 12/23/2019 10/01/2019 06/26/2019  Decreased Interest Down, Depressed, Hopeless 0  PHQ - 2 Score Altered sleeping Tired, decreased energy Change in appetite 0 0  Feeling bad or failure about yourself  0 0 0 0 0  Trouble concentrating Moving slowly or fidgety/restless 2 0 0 0 0  Suicidal thoughts 0 0 0 0 0  PHQ-9 Score Difficult doing work/chores Not difficult at all - Not difficult at all Not difficult at all Somewhat difficult  Some recent data might be hidden   GAD 7 : Generalized Anxiety Score 12/30/2020 06/25/2020 12/23/2019 06/26/2019  Nervous, Anxious, on Edge Control/stop worrying Worry too much - different things Trouble relaxing 0 Restless 0 1 1 0  Easily annoyed or irritable Afraid - awful might happen 0 0 0 0  Total GAD 7 Score Anxiety Difficulty Not difficult at all Somewhat difficult Not difficult at all Somewhat difficult     The patient does not have a history of falls. I did not complete a risk assessment for falls. A plan of care for falls was not documented.   Past Medical History:  Past Medical History:  Diagnosis Date  . Asthma   . Constipation   . Depression   . Hypertension   . Insomnia     Surgical History:  History reviewed. No pertinent surgical history.  Medications:  Current Outpatient Medications on File Prior to Visit  Medication Sig  . clonazePAM (KLONOPIN)  0.5 MG tablet TAKE 1 TABLET BY MOUTH ONCE DAILY AS NEEDED ANXIETY  . Fluticasone-Salmeterol (ADVAIR DISKUS) 250-50 MCG/DOSE AEPB Inhale 1 puff into the lungs 2 (two) times daily.  . hydrocortisone (ANUSOL-HC) 2.5 % rectal cream Place 1 application rectally 2 (two) times daily.  . ISIBLOOM 0.15-30 MG-MCG tablet TAKE 1 TABLET BY MOUTH ONCE DAILY  . levothyroxine (SYNTHROID) 50 MCG tablet TAKE 1 TABLET BY MOUTH ONCE DAILY BEFOREBREAKFAST  . montelukast (SINGULAIR) 10 MG tablet TAKE 1 TABLET BY MOUTH ONCE DAILY  . PROAIR HFA 108 (90 Base) MCG/ACT inhaler INHALE 2 PUFFS BY MOUTH EVERY 4 HOURS ASNEEDED WHEEZING/ SHORTNESS OFBREATH  . zolpidem (AMBIEN) 10 MG tablet TAKE 1 TABLET BY MOUTH AT BEDTIME AS NEEDED SLEEP  . lisinopril (ZESTRIL) 5 MG tablet Take 5 mg by mouth daily. (Patient not taking: Reported on 12/30/2020)  . SUMAtriptan (IMITREX) 20 MG/ACT nasal spray Place 1 spray (20 mg total) into the nose once for 1 dose. May repeat in 2 hours if headache persists or recurs.   No current facility-administered medications on file prior to visit.    Allergies:  No Known Allergies  Social History:  Social History   Socioeconomic History  . Marital status: Single    Spouse name: Not on file  . Number of children: Not on file  . Years of education: Not on file  . Highest education level: Not on file  Occupational History  . Not on file  Tobacco Use  . Smoking status: Never Smoker  . Smokeless tobacco: Never Used  Vaping Use  . Vaping Use: Never used  Substance and Sexual Activity  . Alcohol use: No    Alcohol/week: 0.0 standard drinks  . Drug use: No  . Sexual activity: Never  Other Topics Concern  . Not on file  Social History Narrative  . Not on file   Social Determinants of Health   Financial Resource Strain: Not on file  Food Insecurity: Not on file  Transportation Needs: Not on file  Physical Activity: Not on file  Stress: Not on file  Social Connections: Not on file   Intimate Partner Violence: Not on file   Social History   Tobacco Use  Smoking Status Never Smoker  Smokeless Tobacco Never Used   Social History   Substance and Sexual Activity  Alcohol Use No  . Alcohol/week: 0.0 standard drinks    Family History:  Family History  Problem Relation Age of Onset  . Depression Mother   . Migraines Sister   . Breast cancer Maternal Grandmother     Past medical history, surgical history, medications, allergies, family history and social history reviewed with patient today and changes made to appropriate areas of the chart.   Review of Systems - negative All other ROS negative except what is listed above and in the HPI.      Objective:    BP 118/74 (BP Location: Left Arm)   Pulse 85   Ht 5\' 4"  (1.626 m)   Wt 190 lb 3.2 oz (86.3 kg)   SpO2 98%   BMI 32.65 kg/m   Wt Readings from Last 3 Encounters:  12/30/20 190 lb 3.2 oz (86.3 kg)  06/25/20 176 lb 9.6 oz (80.1 kg)  12/23/19 178 lb 9.6 oz (81 kg)    Physical Exam Constitutional:      General: She is awake. She is not in acute distress.    Appearance: She is well-developed. She is not ill-appearing.  HENT:     Head: Normocephalic and atraumatic.     Right Ear: Hearing, tympanic membrane, ear canal and external ear normal. No drainage.     Left Ear: Hearing, tympanic membrane, ear canal and external ear normal. No drainage.     Nose: Nose normal.     Right Sinus: No maxillary sinus tenderness or frontal sinus tenderness.     Left Sinus: No maxillary sinus tenderness or frontal sinus tenderness.     Mouth/Throat:     Mouth: Mucous membranes are moist.     Pharynx: Oropharynx is clear. Uvula midline. No pharyngeal swelling, oropharyngeal exudate or posterior oropharyngeal erythema.  Eyes:     General: Lids are normal.        Right eye: No discharge.        Left eye: No discharge.     Extraocular Movements: Extraocular movements intact.     Conjunctiva/sclera: Conjunctivae normal.      Pupils: Pupils are equal, round, and reactive to light.     Visual Fields: Right eye visual fields normal and left eye visual fields normal.  Neck:  Thyroid: No thyromegaly.     Vascular: No carotid bruit.     Trachea: Trachea normal.  Cardiovascular:     Rate and Rhythm: Normal rate and regular rhythm.     Heart sounds: Normal heart sounds. No murmur heard. No gallop.   Pulmonary:     Effort: Pulmonary effort is normal. No accessory muscle usage or respiratory distress.     Breath sounds: Normal breath sounds.  Chest:  Breasts:     Right: Normal. No axillary adenopathy or supraclavicular adenopathy.     Left: Normal. No axillary adenopathy or supraclavicular adenopathy.    Abdominal:     General: Bowel sounds are normal.     Palpations: Abdomen is soft. There is no hepatomegaly or splenomegaly.     Tenderness: There is no abdominal tenderness.  Musculoskeletal:        General: Normal range of motion.     Cervical back: Normal range of motion and neck supple.     Right lower leg: No edema.     Left lower leg: No edema.  Lymphadenopathy:     Head:     Right side of head: No submental, submandibular, tonsillar, preauricular or posterior auricular adenopathy.     Left side of head: No submental, submandibular, tonsillar, preauricular or posterior auricular adenopathy.     Cervical: No cervical adenopathy.     Upper Body:     Right upper body: No supraclavicular, axillary or pectoral adenopathy.     Left upper body: No supraclavicular, axillary or pectoral adenopathy.  Skin:    General: Skin is warm and dry.     Capillary Refill: Capillary refill takes less than 2 seconds.     Findings: No rash.  Neurological:     Mental Status: She is alert and oriented to person, place, and time.     Cranial Nerves: Cranial nerves are intact.     Gait: Gait is intact.     Deep Tendon Reflexes: Reflexes are normal and symmetric.     Reflex Scores:      Brachioradialis reflexes are  2+ on the right side and 2+ on the left side.      Patellar reflexes are 2+ on the right side and 2+ on the left side. Psychiatric:        Attention and Perception: Attention normal.        Mood and Affect: Mood normal.        Speech: Speech normal.        Behavior: Behavior normal. Behavior is cooperative.        Thought Content: Thought content normal.        Judgment: Judgment normal.    Results for orders placed or performed in visit on 06/25/20  Basic metabolic panel  Result Value Ref Range   Glucose 71 65 - 99 mg/dL   BUN 11 6 - 24 mg/dL   Creatinine, Ser 8.28 0.57 - 1.00 mg/dL   GFR calc non Af Amer 99 >59 mL/min/1.73   GFR calc Af Amer 115 >59 mL/min/1.73   BUN/Creatinine Ratio 15 9 - 23   Sodium 140 134 - 144 mmol/L   Potassium 5.1 3.5 - 5.2 mmol/L   Chloride 104 96 - 106 mmol/L   CO2 24 20 - 29 mmol/L   Calcium 9.4 8.7 - 10.2 mg/dL  Lipid Panel w/o Chol/HDL Ratio  Result Value Ref Range   Cholesterol, Total 204 (H) 100 - 199 mg/dL   Triglycerides 003 0 -  149 mg/dL   HDL 65 >67 mg/dL   VLDL Cholesterol Cal 23 5 - 40 mg/dL   LDL Chol Calc (NIH) 619 (H) 0 - 99 mg/dL      Assessment & Plan:   Problem List Items Addressed This Visit      Cardiovascular and Mediastinum   Hypertension    Chronic, with initial BP elevated in office today and repeat at goal.  Continue current medication regimen and adjust as needed.  Recommend she monitor BP at least a few mornings a week at home and document.  DASH diet at home.  Refills sent in.  Labs today: CBC, CMP, TSH.  Return in 6 months.       Relevant Medications   lisinopril (ZESTRIL) 5 MG tablet   Other Relevant Orders   CBC with Differential/Platelet   Comprehensive metabolic panel   Microalbumin, Urine Waived   Migraines    Chronic, stable. Continue desogestrel-ethinyl estradiol continuous for hormonal maintenance. Continue use of sumatriptan as prescribed PRN for migraine headaches. Discussed the option of continuing  continuous birth control up to age 17 for migraine prevention or allowing a one week break every month for menstrual cycle. Pt opted for continual birth control use at this time. Educated on the importance of annual mammograms with this option. Pt to notify provider with any changes in headaches, frequency, or severity.       Relevant Medications   lisinopril (ZESTRIL) 5 MG tablet     Respiratory   Asthma    Chronic, stable. Pt to continue current medication regimen as prescribed. Pt to notify provider with change in symptoms or increased use/need of rescue inhaler. Follow-up in appx 6 months or sooner if needs arise.         Endocrine   Hypothyroidism    Chronic, stable.  Continue current Levothyroxine dose and adjusted as needed.  Labs today.      Relevant Orders   TSH   T4, free     Other   Insomnia    Chronic, stable with intermittent Ambien use.  Discussed sleep hygiene methods that may benefit her. Last fill on PDMP review 06/25/20, uses minimally.  Contract signed for controlled substance and in chart and UDS obtained today.  Refills sent today.  Return in 6 months for follow-up.      Depression - Primary    Chronic, stable. To continue therapeutic exercises learned in therapy and PRN clonazepam as needed for acute events. To notify provider of increase in anxious mood or increased need for medication use.  Refills sent.  Obtained UDS today, contract up to date. Follow-up in 6 months.      Generalized anxiety disorder    Chronic, stable with intermittent use of Klonopin.  Last fill on PDMP review 06/25/20, she minimally uses, refills sent today.  Obtained UDS today and controlled substance on filet.  Return in 6 months.      Elevated LDL cholesterol level    Continue diet focus at this time and recheck lipid panel today -- fasting.  ASCVD 1.1%.      Relevant Orders   Lipid Panel w/o Chol/HDL Ratio   Vitamin D deficiency    Reports history of low level, check next visit  and continue daily supplement, adjust as needed.      Long-term current use of benzodiazepine    Obtain UDS today.  Discussed at length risk of long term benzo use, at this time she uses appropriately, minimally.  Recommend  continued minimal use.      Relevant Orders   [id number] 11+Oxyco+Alc+Crt-Bund   History of 2019 novel coronavirus disease (COVID-19)    Continues with ongoing fatigue -- recommend daily supplements, rest, and hydration.  If ongoing return to office.  Labs today.      Proteinuria    With HTN -- urine ALB today 80.  Continue Lisinopril for kidney protection and check CMP.         Other Visit Diagnoses    Need for hepatitis C screening test       Hep C screening on labs today   Relevant Orders   Hepatitis C antibody   Annual physical exam       Annual labs today.  Reviewed health maintenance with patient.       Follow up plan: Return in about 6 months (around 06/29/2021) for HTN/HLD, Thyroid, MOOD.   LABORATORY TESTING:  - Pap smear: refuses  IMMUNIZATIONS:   - Tdap: Tetanus vaccination status reviewed: last tetanus booster within 10 years. - Influenza: Refused - Pneumovax: Up To Date - Prevnar: Not applicable - HPV: Not applicable - Zostavax vaccine: Not applicable  SCREENING: -Mammogram: Up to date  - Colonoscopy: Refuses today -- had at length discussion - Bone Density: Not applicable  -Hearing Test: Not applicable  -Spirometry: Not applicable   PATIENT COUNSELING:   Advised to take 1 mg of folate supplement per day if capable of pregnancy.   Sexuality: Discussed sexually transmitted diseases, partner selection, use of condoms, avoidance of unintended pregnancy  and contraceptive alternatives.   Advised to avoid cigarette smoking.  I discussed with the patient that most people either abstain from alcohol or drink within safe limits (<=14/week and <=4 drinks/occasion for males, <=7/weeks and <= 3 drinks/occasion for females) and that the risk  for alcohol disorders and other health effects rises proportionally with the number of drinks per week and how often a drinker exceeds daily limits.  Discussed cessation/primary prevention of drug use and availability of treatment for abuse.   Diet: Encouraged to adjust caloric intake to maintain  or achieve ideal body weight, to reduce intake of dietary saturated fat and total fat, to limit sodium intake by avoiding high sodium foods and not adding table salt, and to maintain adequate dietary potassium and calcium preferably from fresh fruits, vegetables, and low-fat dairy products.    Stressed the importance of regular exercise  Injury prevention: Discussed safety belts, safety helmets, smoke detector, smoking near bedding or upholstery.   Dental health: Discussed importance of regular tooth brushing, flossing, and dental visits.    NEXT PREVENTATIVE PHYSICAL DUE IN 1 YEAR. Return in about 6 months (around 06/29/2021) for HTN/HLD, Thyroid, MOOD.

## 2020-12-30 NOTE — Addendum Note (Signed)
Addended by: Aura Dials T on: 12/30/2020 09:08 AM   Modules accepted: Orders

## 2020-12-30 NOTE — Assessment & Plan Note (Signed)
Continues with ongoing fatigue -- recommend daily supplements, rest, and hydration.  If ongoing return to office.  Labs today.

## 2020-12-30 NOTE — Assessment & Plan Note (Signed)
Chronic, stable. To continue therapeutic exercises learned in therapy and PRN clonazepam as needed for acute events. To notify provider of increase in anxious mood or increased need for medication use.  Refills sent.  Obtained UDS today, contract up to date. Follow-up in 6 months.

## 2020-12-30 NOTE — Assessment & Plan Note (Signed)
Obtain UDS today.  Discussed at length risk of long term benzo use, at this time she uses appropriately, minimally.  Recommend continued minimal use.

## 2020-12-30 NOTE — Assessment & Plan Note (Signed)
Reports history of low level, check next visit and continue daily supplement, adjust as needed.

## 2020-12-30 NOTE — Patient Instructions (Signed)

## 2020-12-30 NOTE — Assessment & Plan Note (Signed)
Continue diet focus at this time and recheck lipid panel today -- fasting.  ASCVD 1.1%.

## 2020-12-30 NOTE — Assessment & Plan Note (Signed)
Chronic, stable. Pt to continue current medication regimen as prescribed. Pt to notify provider with change in symptoms or increased use/need of rescue inhaler. Follow-up in appx 6 months or sooner if needs arise.

## 2020-12-30 NOTE — Assessment & Plan Note (Signed)
Chronic, with initial BP elevated in office today and repeat at goal.  Continue current medication regimen and adjust as needed.  Recommend she monitor BP at least a few mornings a week at home and document.  DASH diet at home.  Refills sent in.  Labs today: CBC, CMP, TSH.  Return in 6 months.

## 2020-12-30 NOTE — Assessment & Plan Note (Signed)
With HTN -- urine ALB today 80.  Continue Lisinopril for kidney protection and check CMP.

## 2020-12-30 NOTE — Assessment & Plan Note (Signed)
Chronic, stable.  Continue current Levothyroxine dose and adjusted as needed.  Labs today.

## 2020-12-30 NOTE — Assessment & Plan Note (Signed)
Chronic, stable. Continue desogestrel-ethinyl estradiol continuous for hormonal maintenance. Continue use of sumatriptan as prescribed PRN for migraine headaches. Discussed the option of continuing continuous birth control up to age 50 for migraine prevention or allowing a one week break every month for menstrual cycle. Pt opted for continual birth control use at this time. Educated on the importance of annual mammograms with this option. Pt to notify provider with any changes in headaches, frequency, or severity.  

## 2020-12-30 NOTE — Assessment & Plan Note (Signed)
Chronic, stable with intermittent use of Klonopin.  Last fill on PDMP review 06/25/20, she minimally uses, refills sent today.  Obtained UDS today and controlled substance on filet.  Return in 6 months.

## 2020-12-30 NOTE — Assessment & Plan Note (Signed)
Chronic, stable with intermittent Ambien use.  Discussed sleep hygiene methods that may benefit her. Last fill on PDMP review 06/25/20, uses minimally.  Contract signed for controlled substance and in chart and UDS obtained today.  Refills sent today.  Return in 6 months for follow-up.

## 2020-12-31 LAB — DRUG SCREEN 764883 11+OXYCO+ALC+CRT-BUND
Amphetamines, Urine: NEGATIVE ng/mL
BENZODIAZ UR QL: NEGATIVE ng/mL
Barbiturate: NEGATIVE ng/mL
Cannabinoid Quant, Ur: NEGATIVE ng/mL
Cocaine (Metabolite): NEGATIVE ng/mL
Creatinine: 144.2 mg/dL (ref 20.0–300.0)
Ethanol: NEGATIVE %
Meperidine: NEGATIVE ng/mL
Methadone Screen, Urine: NEGATIVE ng/mL
OPIATE SCREEN URINE: NEGATIVE ng/mL
Oxycodone/Oxymorphone, Urine: NEGATIVE ng/mL
Phencyclidine: NEGATIVE ng/mL
Propoxyphene: NEGATIVE ng/mL
Tramadol: NEGATIVE ng/mL
pH, Urine: 6.5 (ref 4.5–8.9)

## 2020-12-31 LAB — LIPID PANEL W/O CHOL/HDL RATIO
Cholesterol, Total: 200 mg/dL — ABNORMAL HIGH (ref 100–199)
HDL: 62 mg/dL (ref >39)
LDL Chol Calc (NIH): 117 mg/dL — ABNORMAL HIGH (ref 0–99)
Triglycerides: 121 mg/dL (ref 0–149)
VLDL Cholesterol Cal: 21 mg/dL (ref 5–40)

## 2020-12-31 LAB — CBC WITH DIFFERENTIAL/PLATELET
Basophils Absolute: 0.1 10*3/uL (ref 0.0–0.2)
Basos: 1 %
EOS (ABSOLUTE): 0.3 10*3/uL (ref 0.0–0.4)
Eos: 3 %
Hematocrit: 41.1 % (ref 34.0–46.6)
Hemoglobin: 14 g/dL (ref 11.1–15.9)
Immature Grans (Abs): 0 10*3/uL (ref 0.0–0.1)
Immature Granulocytes: 0 %
Lymphocytes Absolute: 3.2 10*3/uL — ABNORMAL HIGH (ref 0.7–3.1)
Lymphs: 35 %
MCH: 32.3 pg (ref 26.6–33.0)
MCHC: 34.1 g/dL (ref 31.5–35.7)
MCV: 95 fL (ref 79–97)
Monocytes Absolute: 0.4 10*3/uL (ref 0.1–0.9)
Monocytes: 5 %
Neutrophils Absolute: 5.2 10*3/uL (ref 1.4–7.0)
Neutrophils: 56 %
Platelets: 296 10*3/uL (ref 150–450)
RBC: 4.33 x10E6/uL (ref 3.77–5.28)
RDW: 11.5 % — ABNORMAL LOW (ref 11.7–15.4)
WBC: 9.1 10*3/uL (ref 3.4–10.8)

## 2020-12-31 LAB — T4, FREE: Free T4: 1.19 ng/dL (ref 0.82–1.77)

## 2020-12-31 LAB — COMPREHENSIVE METABOLIC PANEL
ALT: 12 IU/L (ref 0–32)
AST: 15 IU/L (ref 0–40)
Albumin/Globulin Ratio: 1.3 (ref 1.2–2.2)
Albumin: 4.3 g/dL (ref 3.8–4.8)
Alkaline Phosphatase: 69 IU/L (ref 44–121)
BUN/Creatinine Ratio: 11 (ref 9–23)
BUN: 9 mg/dL (ref 6–24)
Bilirubin Total: 0.3 mg/dL (ref 0.0–1.2)
CO2: 17 mmol/L — ABNORMAL LOW (ref 20–29)
Calcium: 9.3 mg/dL (ref 8.7–10.2)
Chloride: 102 mmol/L (ref 96–106)
Creatinine, Ser: 0.79 mg/dL (ref 0.57–1.00)
GFR calc Af Amer: 102 mL/min/{1.73_m2} (ref >59)
GFR calc non Af Amer: 88 mL/min/{1.73_m2} (ref >59)
Globulin, Total: 3.2 g/dL (ref 1.5–4.5)
Glucose: 88 mg/dL (ref 65–99)
Potassium: 4.7 mmol/L (ref 3.5–5.2)
Sodium: 138 mmol/L (ref 134–144)
Total Protein: 7.5 g/dL (ref 6.0–8.5)

## 2020-12-31 LAB — TSH: TSH: 1.86 u[IU]/mL (ref 0.450–4.500)

## 2020-12-31 LAB — HEPATITIS C ANTIBODY: Hep C Virus Ab: <0.1 s/co ratio (ref 0.0–0.9)

## 2020-12-31 NOTE — Progress Notes (Signed)
Good morning, please let Gladis know her labs have returned and overall they remain stable.  CO2 a little on low side post Covid, would recommend you use your Advair daily to open up airways.  Thyroid levels normal, continue current Levothyroxine dosing.  Cholesterol levels remain elevated, but ongoing recommendation to focus on diet and regular activity at this time. If any questions let me know.  Have a great day!! Keep being awesome!!  Thank you for allowing me to participate in your care. Kindest regards, Vienne Corcoran

## 2021-02-25 ENCOUNTER — Other Ambulatory Visit: Payer: Self-pay | Admitting: Nurse Practitioner

## 2021-02-25 NOTE — Telephone Encounter (Signed)
Routing to provider  

## 2021-02-25 NOTE — Telephone Encounter (Signed)
Pt last apt on 12/30/2020 per last note Return in about 6 months (around 06/29/2021) for HTN/HLD, Thyroid, MOOD. Next apt on 06/30/2021

## 2021-02-25 NOTE — Telephone Encounter (Signed)
Requested medication (s) are due for refill today: Clonazepam & Zolpidem  Requested medication (s) are on the active medication list: yes  Last refill: 01/26/21  Future visit scheduled: yes  Notes to clinic:  not delegated    Requested Prescriptions  Pending Prescriptions Disp Refills   zolpidem (AMBIEN) 10 MG tablet [Pharmacy Med Name: ZOLPIDEM TARTRATE 10 MG TAB] 30 tablet     Sig: TAKE 1 TABLET BY MOUTH AT BEDTIME AS NEEDED SLEEP      Not Delegated - Psychiatry:  Anxiolytics/Hypnotics Failed - 02/25/2021  3:02 PM      Failed - This refill cannot be delegated      Passed - Urine Drug Screen completed in last 360 days      Passed - Valid encounter within last 6 months    Recent Outpatient Visits           1 month ago Mild episode of recurrent major depressive disorder (HCC)   [facility] Cannady, Jolene T, NP   8 months ago Mild episode of recurrent major depressive disorder (HCC)   [facility] Cannady, Dorie Rank, NP   1 year ago Annual physical exam   [facility] [place], Corrie Dandy T, NP   1 year ago Depression, unspecified depression type   [facility] [place], Dorie Rank, NP   1 year ago Essential hypertension   [facility] [place], Dorie Rank, NP       Future Appointments             In 4 months Cannady, Dorie Rank, NP [facility]               clonazePAM (KLONOPIN) 0.5 MG tablet [Pharmacy Med Name: CLONAZEPAM 0.5 MG TAB] 30 tablet     Sig: TAKE 1 TABLET BY MOUTH ONCE DAILY AS NEEDED ANXIETY      Not Delegated - Psychiatry:  Anxiolytics/Hypnotics Failed - 02/25/2021  3:02 PM      Failed - This refill cannot be delegated      Passed - Urine Drug Screen completed in last 360 days      Passed - Valid encounter within last 6 months    Recent Outpatient Visits           1 month ago Mild episode of recurrent major depressive disorder (HCC)   [facility] Cannady, Jolene T,  NP   8 months ago Mild episode of recurrent major depressive disorder (HCC)   [facility] Cannady, Dorie Rank, NP   1 year ago Annual physical exam   [facility] [place], Corrie Dandy T, NP   1 year ago Depression, unspecified depression type   [facility] [place], Dorie Rank, NP   1 year ago Essential hypertension   [facility] [place], Dorie Rank, NP       Future Appointments             In 4 months Cannady, Dorie Rank, NP [facility]

## 2021-02-25 NOTE — Telephone Encounter (Signed)
PDMP reviewed and last fill 01/26/21

## 2021-03-25 ENCOUNTER — Other Ambulatory Visit: Payer: Self-pay | Admitting: Nurse Practitioner

## 2021-03-25 NOTE — Telephone Encounter (Signed)
Requested Prescriptions  Pending Prescriptions Disp Refills  . ADVAIR DISKUS 250-50 MCG/ACT AEPB [Pharmacy Med Name: ADVAIR DISKUS 250-50 MCG/ACT INH AE] 60 each 0    Sig: INHALE 1 PUFF BY MOUTH TWICE DAILY. RINSE MOUTH AFTER USE.     Pulmonology:  Combination Products Passed - 03/25/2021  6:22 PM      Passed - Valid encounter within last 12 months    Recent Outpatient Visits          2 months ago Mild episode of recurrent major depressive disorder (HCC)   [facility] Cannady, Jolene T, NP   9 months ago Mild episode of recurrent major depressive disorder (HCC)   [facility] Cannady, Dorie Rank, NP   1 year ago Annual physical exam   [facility] [place], Corrie Dandy T, NP   1 year ago Depression, unspecified depression type   [facility] [place], Dorie Rank, NP   1 year ago Essential hypertension   [facility] [place], Dorie Rank, NP      Future Appointments            In 3 months Cannady, Dorie Rank, NP [facility]           . ISIBLOOM 0.15-30 MG-MCG tablet [Pharmacy Med Name: ISIBLOOM 0.15-30 MG-MCG TAB] 84 tablet 0    Sig: TAKE 1 TABLET BY MOUTH ONCE DAILY     OB/GYN:  Contraceptives Passed - 03/25/2021  6:22 PM      Passed - Last BP in normal range    BP Readings from Last 1 Encounters:  12/30/20 118/74         Passed - Valid encounter within last 12 months    Recent Outpatient Visits          2 months ago Mild episode of recurrent major depressive disorder (HCC)   [facility] Cannady, Jolene T, NP   9 months ago Mild episode of recurrent major depressive disorder (HCC)   [facility] Cannady, Dorie Rank, NP   1 year ago Annual physical exam   [facility] [place], Corrie Dandy T, NP   1 year ago Depression, unspecified depression type   [facility] [place], Dorie Rank, NP   1 year ago Essential hypertension   [facility] [place], Dorie Rank,  NP      Future Appointments            In 3 months Cannady, Jolene T, NP [facility]           . montelukast (SINGULAIR) 10 MG tablet [Pharmacy Med Name: MONTELUKAST SODIUM 10 MG TAB] 90 tablet 0    Sig: TAKE 1 TABLET BY MOUTH ONCE DAILY     Pulmonology:  Leukotriene Inhibitors Passed - 03/25/2021  6:22 PM      Passed - Valid encounter within last 12 months    Recent Outpatient Visits          2 months ago Mild episode of recurrent major depressive disorder (HCC)   [facility] Cannady, Jolene T, NP   9 months ago Mild episode of recurrent major depressive disorder (HCC)   [facility] Cannady, Dorie Rank, NP   1 year ago Annual physical exam   [facility] [place], Corrie Dandy T, NP   1 year ago Depression, unspecified depression type   [facility] [place], Dorie Rank, NP   1 year ago Essential hypertension   [facility] Marjie Skiff, NP      Future Appointments            In 3 months Cannady, Dorie Rank, NP [facility]           . levothyroxine (SYNTHROID) 50 MCG tablet [Pharmacy Med Name: LEVOTHYROXINE SODIUM 50 MCG TAB] 90 tablet 2    Sig: TAKE 1 TABLET BY MOUTH ONCE DAILY BEFOREBREAKFAST     Endocrinology:  Hypothyroid Agents Failed - 03/25/2021  6:22 PM      Failed - TSH needs to be rechecked within 3 months after an abnormal result. Refill until TSH is due.      Passed - TSH in normal range and within 360 days    TSH  Date Value Ref Range Status  12/30/2020 1.860 0.450 - 4.500 uIU/mL Final         Passed - Valid encounter within last 12 months    Recent Outpatient Visits          2 months ago Mild episode of recurrent major depressive disorder (HCC)   [facility] Cannady, Jolene T, NP   9 months ago Mild episode of recurrent major depressive disorder (HCC)   [facility] Cannady, Dorie Rank, NP   1 year ago Annual physical exam   [facility] [place], Corrie Dandy T, NP   1 year ago Depression, unspecified depression type   [facility] [place], Dorie Rank, NP   1 year ago Essential hypertension   [facility] [place], Dorie Rank, NP      Future Appointments            In 3 months Cannady, Dorie Rank, NP [facility]

## 2021-05-26 ENCOUNTER — Other Ambulatory Visit: Payer: Self-pay | Admitting: Nurse Practitioner

## 2021-05-27 NOTE — Telephone Encounter (Signed)
Requested medications are due for refill today yes  Requested medications are on the active medication list yes  Last refill 6/17  Last visit 12/2020  Future visit scheduled 06/2021  Notes to clinic Not Delegated.

## 2021-05-31 NOTE — Telephone Encounter (Signed)
Pt next ap on 06/30/2021

## 2021-06-08 ENCOUNTER — Other Ambulatory Visit: Payer: Self-pay | Admitting: Nurse Practitioner

## 2021-06-30 ENCOUNTER — Encounter: Payer: Self-pay | Admitting: Nurse Practitioner

## 2021-06-30 ENCOUNTER — Ambulatory Visit (INDEPENDENT_AMBULATORY_CARE_PROVIDER_SITE_OTHER): Payer: Managed Care, Other (non HMO) | Admitting: Nurse Practitioner

## 2021-06-30 ENCOUNTER — Other Ambulatory Visit: Payer: Self-pay

## 2021-06-30 VITALS — BP 126/79 | HR 78 | Temp 98.9°F | Ht 63.0 in | Wt 196.0 lb

## 2021-06-30 DIAGNOSIS — E7800 Pure hypercholesterolemia, unspecified: Secondary | ICD-10-CM | POA: Diagnosis not present

## 2021-06-30 DIAGNOSIS — F330 Major depressive disorder, recurrent, mild: Secondary | ICD-10-CM | POA: Diagnosis not present

## 2021-06-30 DIAGNOSIS — J4520 Mild intermittent asthma, uncomplicated: Secondary | ICD-10-CM

## 2021-06-30 DIAGNOSIS — E039 Hypothyroidism, unspecified: Secondary | ICD-10-CM | POA: Diagnosis not present

## 2021-06-30 DIAGNOSIS — Z79899 Other long term (current) drug therapy: Secondary | ICD-10-CM

## 2021-06-30 DIAGNOSIS — F411 Generalized anxiety disorder: Secondary | ICD-10-CM

## 2021-06-30 DIAGNOSIS — I10 Essential (primary) hypertension: Secondary | ICD-10-CM | POA: Diagnosis not present

## 2021-06-30 DIAGNOSIS — Z6834 Body mass index (BMI) 34.0-34.9, adult: Secondary | ICD-10-CM

## 2021-06-30 DIAGNOSIS — G43909 Migraine, unspecified, not intractable, without status migrainosus: Secondary | ICD-10-CM

## 2021-06-30 DIAGNOSIS — E6609 Other obesity due to excess calories: Secondary | ICD-10-CM

## 2021-06-30 DIAGNOSIS — F5101 Primary insomnia: Secondary | ICD-10-CM

## 2021-06-30 NOTE — Assessment & Plan Note (Signed)
Chronic, stable with intermittent Ambien use.  Discussed sleep hygiene methods that may benefit her. Contract signed for controlled substance and in chart and UDS up to date.  Refills sent up to date.  Return in 6 months for physical.

## 2021-06-30 NOTE — Assessment & Plan Note (Signed)
Chronic, stable. Continue desogestrel-ethinyl estradiol continuous for hormonal maintenance. Continue use of sumatriptan as prescribed PRN for migraine headaches. Discussed the option of continuing continuous birth control up to age 50 for migraine prevention or allowing a one week break every month for menstrual cycle. Pt opted for continual birth control use at this time. Educated on the importance of annual mammograms with this option. Pt to notify provider with any changes in headaches, frequency, or severity.  

## 2021-06-30 NOTE — Assessment & Plan Note (Signed)
Chronic, stable with BP at goal in office and at home.  Continue current medication regimen and adjust as needed.  Recommend she monitor BP at least a few mornings a week at home and document.  DASH diet at home.  Refills up to date.  Labs today: CMP.  Return in 6 months for physical.

## 2021-06-30 NOTE — Assessment & Plan Note (Signed)
Chronic, stable with intermittent use of Klonopin. Refills up to date.  UDS up to date and controlled substance on file.  Return in 6 months.

## 2021-06-30 NOTE — Patient Instructions (Signed)
[employer] Textbook of Endocrinology (14th ed., pp. 574-641). [place], [place]: Elsevier.">  Perimenopause Perimenopause is the normal time of a woman's life when the levels of estrogen, the female hormone produced by the ovaries, begin to decrease. This leads to changes in menstrual periods before they stop completely (menopause). Perimenopause can begin 2-8 years before menopause. During perimenopause,the ovaries may or may not produce an egg and a woman can still become pregnant. What are the causes? This condition is caused by a natural change in hormone levels that happens asyou get older. What increases the risk? This condition is more likely to start at an earlier age if you have certain medical conditions or have undergone treatments, including: A tumor of the pituitary gland in the brain. A disease that affects the ovaries and hormone production. Certain cancer treatments, such as chemotherapy or hormone therapy, or radiation therapy on the pelvis. Heavy smoking and excessive alcohol use. Family history of early menopause. What are the signs or symptoms? Perimenopausal changes affect each woman differently. Symptoms of this condition may include: Hot flashes. Irregular menstrual periods. Night sweats. Changes in feelings about sex. This could be a decrease in sex drive or an increased discomfort around your sexuality. Vaginal dryness. Headaches. Mood swings. Depression. Problems sleeping (insomnia). Memory problems or trouble concentrating. Irritability. Tiredness. Weight gain. Anxiety. Trouble getting pregnant. How is this diagnosed? This condition is diagnosed based on your medical history, a physical exam, your age, your menstrual history, and your symptoms. Hormone tests may also bedone. How is this treated? In some cases, no treatment is needed. You and your health care provider should make a decision together about whether treatment is necessary. Treatment will be based  on your individual condition and preferences. Various treatments are available, such as: Menopausal hormone therapy (MHT). Medicines to treat specific symptoms. Acupuncture. Vitamin or herbal supplements. Before starting treatment, make sure to let your health care provider know if you have a personal or family history of: Heart disease. Breast cancer. Blood clots. Diabetes. Osteoporosis. Follow these instructions at home: Medicines Take over-the-counter and prescription medicines only as told by your health care provider. Take vitamin supplements only as told by your health care provider. Talk with your health care provider before starting any herbal supplements. Lifestyle  Do not use any products that contain nicotine or tobacco, such as cigarettes, e-cigarettes, and chewing tobacco. If you need help quitting, ask your health care provider. Get at least 30 minutes of physical activity on 5 or more days each week. Eat a balanced diet that includes fresh fruits and vegetables, whole grains, soybeans, eggs, lean meat, and low-fat dairy. Avoid alcoholic and caffeinated beverages, as well as spicy foods. This may help prevent hot flashes. Get 7-8 hours of sleep each night. Dress in layers that can be removed to help you manage hot flashes. Find ways to manage stress, such as deep breathing, meditation, or journaling.  General instructions  Keep track of your menstrual periods, including: When they occur. How heavy they are and how long they last. How much time passes between periods. Keep track of your symptoms, noting when they start, how often you have them, and how long they last. Use vaginal lubricants or moisturizers to help with vaginal dryness and improve comfort during sex. You can still become pregnant if you are having irregular periods. Make sure you use contraception during perimenopause if you do not want to get pregnant. Keep all follow-up visits. This is important. This  includes any group therapy   or counseling.  Contact a health care provider if: You have heavy vaginal bleeding or pass blood clots. Your period lasts more than 2 days longer than normal. Your periods are recurring sooner than 21 days. You bleed after having sex. You have pain during sex. Get help right away if you have: Chest pain, trouble breathing, or trouble talking. Severe depression. Pain when you urinate. Severe headaches. Vision problems. Summary Perimenopause is the time when a woman's body begins to move into menopause. This may happen naturally or as a result of other health problems or medical treatments. Perimenopause can begin 2-8 years before menopause, and it can last for several years. Perimenopausal symptoms can be managed through medicines, lifestyle changes, and complementary therapies such as acupuncture. This information is not intended to replace advice given to you by your health care provider. Make sure you discuss any questions you have with your healthcare provider. Document Revised: 04/08/2020 Document Reviewed: 04/08/2020 Elsevier Patient Education  2022 [employer]  

## 2021-06-30 NOTE — Progress Notes (Signed)
BP 126/79   Pulse 78   Temp 98.9 F (37.2 C) (Oral)   Ht  (1.6 m)   Wt 196 lb (88.9 kg)   SpO2 98%   BMI 34.72 kg/m    Subjective:    Patient ID: Laurie Martinez, female    DOB: 1971/04/05, 50 y.o.   MRN: [id number]  HPI: Laurie Martinez is a 50 y.o. female  Chief Complaint  Patient presents with   Hyperlipidemia   Hypertension   Mood   Hypothyroidism   HYPERTENSION/HLD Continues Lisinopril 5 MG daily.  Hypertension status: controlled  Satisfied with current treatment? yes Duration of hypertension: chronic BP monitoring frequency:  weekly BP range: 110 to 120/80's -- a few with SBP 130 BP medication side effects:  no Medication compliance: good compliance Aspirin: no Recurrent headaches: history of chronic migraine headaches, not having as often due to BCP Visual changes: no Palpitations: no Dyspnea: no Chest pain: no Lower extremity edema: no Dizzy/lightheaded: no  The 10-year ASCVD risk score [name], et al., 2013) is: 1.3%   Values used to calculate the score:     Age: 84 years     Sex: Female     Is Non-Hispanic African American: No     Diabetic: No     Tobacco smoker: No     Systolic Blood Pressure: 126 mmHg     Is BP treated: Yes     HDL Cholesterol: 62 mg/dL     Total Cholesterol: 200 mg/dL  HYPOTHYROIDISM Last TSH was 1.860 in February 2022.  Continues on Levothyroxine 50 MCG. Thyroid control status:controlled Satisfied with current treatment? yes Medication side effects: no Medication compliance: good compliance Recent dose adjustment:no Fatigue: yes Cold intolerance: no Heat intolerance: no Weight gain: no Weight loss: no Constipation: yes- chronic issue- no change in baseline -- uses Miralax Diarrhea/loose stools: no Palpitations: no Lower extremity edema: no Anxiety/depressed mood: yes - chronic issue- no change in baseline   ASTHMA Taking Singulair and Advair daily and Ventolin PRN. Asthma is seasonal and exercise  induced. Asthma status: controlled Satisfied with current treatment?: yes Albuterol/rescue inhaler frequency: occasional, not often Dyspnea frequency: rare Wheezing frequency: rare Cough frequency: rare Nocturnal symptom frequency: none Limitation of activity: none Current upper respiratory symptoms: no Triggers: seasonal allergies in the spring and fall  Visits to ER or Urgent Care in past year: no Pneumovax: Up to Date Influenza: Not up to Date - patient declines the flu vaccine   DEPRESSION, ANXIETY, & INSOMNIA Continues Ambien and Klonopin. Reports satisfaction with current treatment options and wishes to continue. Pt is aware of risks of benzo medication use to include increased sedation, respiratory suppression, falls, dependence and cardiovascular events.  Pt would like to continue treatment as benefit determined to outweigh risk.   Does believe she is in premenopausal phase -- as more irritable and insomnia is more unpredictable.  At baseline Klonopin use less than once a month as needed for severe symptoms, more in winter. Reports difficulty sleeping most nights, but reserves use of Ambien for weekends and when not working.  Continues use of therapeutic exercises learned in therapy to aide with depression and anxiety symptoms. Reports depression symptoms are worse in the winter, but are well controlled with this method. On review PDMP last Ambien fill 05/31/21 and Klonopin 05/26/21. Mood status: controlled Satisfied with current treatment?: yes Symptom severity: moderate  Duration of current treatment : chronic Side effects: no Medication compliance: good compliance Psychotherapy/counseling: yes  in the past Previous psychiatric medications: several past medications Depressed mood: yes Anxious mood:yes Anhedonia: no Significant weight loss or gain: no Insomnia: yes Fatigue: yes Feelings of worthlessness or guilt: no Impaired concentration/indecisiveness: ocassional Suicidal  ideations: no Hopelessness: no Crying spells: no  Depression screen [facility] 2/9 06/30/2021 12/30/2020 06/25/2020 12/23/2019 10/01/2019  Decreased Interest 0 2 1 1 1   Down, Depressed, Hopeless 0 2 1 1 1   PHQ - 2 Score 0 4 2 2 2   Altered sleeping 3 3 3 3 3   Tired, decreased energy 3 3 3 3 3   Change in appetite 0 2 1 1  0  Feeling bad or failure about yourself  0 0 0 0 0  Trouble concentrating 3 3 3 1 1   Moving slowly or fidgety/restless 0 2 0 0 0  Suicidal thoughts 0 0 0 0 0  PHQ-9 Score 9 17 12 10 9   Difficult doing work/chores Somewhat difficult Not difficult at all - Not difficult at all Not difficult at all  Some recent data might be hidden    Relevant past medical, surgical, family and social history reviewed and updated as indicated. Interim medical history since our last visit reviewed. Allergies and medications reviewed and updated.  Review of Systems  Constitutional:  Negative for activity change, appetite change, diaphoresis, fatigue and fever.  Respiratory:  Negative for cough, chest tightness and shortness of breath.   Cardiovascular:  Negative for chest pain, palpitations and leg swelling.  Gastrointestinal: Negative.   Endocrine: Negative for cold intolerance and heat intolerance.  Neurological: Negative.   Psychiatric/Behavioral:  Positive for sleep disturbance. Negative for decreased concentration, self-injury and suicidal ideas. The patient is not nervous/anxious.    Per HPI unless specifically indicated above     Objective:    BP 126/79   Pulse 78   Temp 98.9 F (37.2 C) (Oral)   Ht 5\' 3"  (1.6 m)   Wt 196 lb (88.9 kg)   SpO2 98%   BMI 34.72 kg/m   Wt Readings from Last 3 Encounters:  06/30/21 196 lb (88.9 kg)  12/30/20 190 lb 3.2 oz (86.3 kg)  06/25/20 176 lb 9.6 oz (80.1 kg)    Physical Exam Vitals and nursing note reviewed.  Constitutional:      General: She is awake. She is not in acute distress.    Appearance: She is well-developed and well-groomed.  She is obese. She is not ill-appearing.  HENT:     Head: Normocephalic.     Right Ear: Hearing normal.     Left Ear: Hearing normal.  Eyes:     General: Lids are normal.        Right eye: No discharge.        Left eye: No discharge.     Conjunctiva/sclera: Conjunctivae normal.     Pupils: Pupils are equal, round, and reactive to light.  Neck:     Thyroid: No thyromegaly.     Vascular: No carotid bruit.  Cardiovascular:     Rate and Rhythm: Normal rate and regular rhythm.     Heart sounds: Normal heart sounds. No murmur heard.   No gallop.  Pulmonary:     Effort: Pulmonary effort is normal. No accessory muscle usage or respiratory distress.     Breath sounds: Normal breath sounds.  Abdominal:     General: Bowel sounds are normal.     Palpations: Abdomen is soft.  Musculoskeletal:     Cervical back: Normal range of motion and neck supple.  Right lower leg: No edema.     Left lower leg: No edema.  Skin:    General: Skin is warm and dry.  Neurological:     Mental Status: She is alert and oriented to person, place, and time.  Psychiatric:        Attention and Perception: Attention normal.        Mood and Affect: Mood normal.        Speech: Speech normal.        Behavior: Behavior normal. Behavior is cooperative.        Thought Content: Thought content normal.   Results for orders placed or performed in visit on 12/30/20  CBC with Differential/Platelet  Result Value Ref Range   WBC 9.1 3.4 - 10.8 x10E3/uL   RBC 4.33 3.77 - 5.28 x10E6/uL   Hemoglobin 14.0 11.1 - 15.9 g/dL   Hematocrit 41.3 24.4 - 46.6 %   MCV 95 79 - 97 fL   MCH 32.3 26.6 - 33.0 pg   MCHC 34.1 31.5 - 35.7 g/dL   RDW 01.0 (L) 27.2 - 53.6 %   Platelets 296 150 - 450 x10E3/uL   Neutrophils 56 Not Estab. %   Lymphs 35 Not Estab. %   Monocytes 5 Not Estab. %   Eos 3 Not Estab. %   Basos 1 Not Estab. %   Neutrophils Absolute 5.2 1.4 - 7.0 x10E3/uL   Lymphocytes Absolute 3.2 (H) 0.7 - 3.1 x10E3/uL    Monocytes Absolute 0.4 0.1 - 0.9 x10E3/uL   EOS (ABSOLUTE) 0.3 0.0 - 0.4 x10E3/uL   Basophils Absolute 0.1 0.0 - 0.2 x10E3/uL   Immature Granulocytes 0 Not Estab. %   Immature Grans (Abs) 0.0 0.0 - 0.1 x10E3/uL  Comprehensive metabolic panel  Result Value Ref Range   Glucose 88 65 - 99 mg/dL   BUN 9 6 - 24 mg/dL   Creatinine, Ser 6.44 0.57 - 1.00 mg/dL   GFR calc non Af Amer 88 >59 mL/min/1.73   GFR calc Af Amer 102 >59 mL/min/1.73   BUN/Creatinine Ratio 11 9 - 23   Sodium 138 134 - 144 mmol/L   Potassium 4.7 3.5 - 5.2 mmol/L   Chloride 102 96 - 106 mmol/L   CO2 17 (L) 20 - 29 mmol/L   Calcium 9.3 8.7 - 10.2 mg/dL   Total Protein 7.5 6.0 - 8.5 g/dL   Albumin 4.3 3.8 - 4.8 g/dL   Globulin, Total 3.2 1.5 - 4.5 g/dL   Albumin/Globulin Ratio 1.3 1.2 - 2.2   Bilirubin Total 0.3 0.0 - 1.2 mg/dL   Alkaline Phosphatase 69 44 - 121 IU/L   AST 15 0 - 40 IU/L   ALT 12 0 - 32 IU/L  Lipid Panel w/o Chol/HDL Ratio  Result Value Ref Range   Cholesterol, Total 200 (H) 100 - 199 mg/dL   Triglycerides 034 0 - 149 mg/dL   HDL 62 >74 mg/dL   VLDL Cholesterol Cal 21 5 - 40 mg/dL   LDL Chol Calc (NIH) 259 (H) 0 - 99 mg/dL  TSH  Result Value Ref Range   TSH 1.860 0.450 - 4.500 uIU/mL  Microalbumin, Urine Waived  Result Value Ref Range   Microalb, Ur Waived 80 (H) 0 - 19 mg/L   Creatinine, Urine Waived 300 10 - 300 mg/dL   Microalb/Creat Ratio 30-300 (H) <30 mg/g  Hepatitis C antibody  Result Value Ref Range   Hep C Virus Ab <0.1 0.0 - 0.9 s/co ratio  [id number] 11+Oxyco+Alc+Crt-Bund  Result Value Ref Range   Ethanol Negative Cutoff=0.020 %   Amphetamines, Urine Negative Cutoff=1000 ng/mL   Barbiturate Negative Cutoff=200 ng/mL   BENZODIAZ UR QL Negative Cutoff=200 ng/mL   Cannabinoid Quant, Ur Negative Cutoff=50 ng/mL   Cocaine (Metabolite) Negative Cutoff=300 ng/mL   OPIATE SCREEN URINE Negative Cutoff=300 ng/mL   Oxycodone/Oxymorphone, Urine Negative Cutoff=300 ng/mL   Phencyclidine  Negative Cutoff=25 ng/mL   Methadone Screen, Urine Negative Cutoff=300 ng/mL   Propoxyphene Negative Cutoff=300 ng/mL   Meperidine Negative Cutoff=200 ng/mL   Tramadol Negative Cutoff=200 ng/mL   Creatinine 144.2 20.0 - 300.0 mg/dL   pH, Urine 6.5 4.5 - 8.9  T4, free  Result Value Ref Range   Free T4 1.19 0.82 - 1.77 ng/dL      Assessment & Plan:   Problem List Items Addressed This Visit       Cardiovascular and Mediastinum   Hypertension    Chronic, stable with BP at goal in office and at home.  Continue current medication regimen and adjust as needed.  Recommend she monitor BP at least a few mornings a week at home and document.  DASH diet at home.  Refills up to date.  Labs today: CMP.  Return in 6 months for physical.       Relevant Orders   Basic metabolic panel   Migraines    Chronic, stable. Continue desogestrel-ethinyl estradiol continuous for hormonal maintenance. Continue use of sumatriptan as prescribed PRN for migraine headaches. Discussed the option of continuing continuous birth control up to age 54 for migraine prevention or allowing a one week break every month for menstrual cycle. Pt opted for continual birth control use at this time. Educated on the importance of annual mammograms with this option. Pt to notify provider with any changes in headaches, frequency, or severity.         Respiratory   Asthma    Chronic, stable. To continue current medication regimen as prescribed. To notify provider with change in symptoms or increased use/need of rescue inhaler. Follow-up in 6 months for annual physical.        Endocrine   Hypothyroidism    Chronic, stable.  Continue current Levothyroxine dose and adjusted as needed.  Labs today.      Relevant Orders   T4, free   Thyroid peroxidase antibody   TSH     Other   Insomnia    Chronic, stable with intermittent Ambien use.  Discussed sleep hygiene methods that may benefit her. Contract signed for controlled  substance and in chart and UDS up to date.  Refills sent up to date.  Return in 6 months for physical.      Depression - Primary    Chronic, stable. Continue therapeutic exercises learned in therapy and PRN clonazepam as needed for acute events. To notify provider of increase in anxious mood or increased need for medication use.  Refills up to date.  Obtained UDS up to date, contract up to date. Follow-up in 6 months.      Generalized anxiety disorder    Chronic, stable with intermittent use of Klonopin. Refills up to date.  UDS up to date and controlled substance on file.  Return in 6 months.      Obesity    BMI 34.72.  Recommended eating smaller high protein, low fat meals more frequently and exercising 30 mins a day 5 times a week with a goal of 10-15lb weight loss in the next 3 months. Patient  voiced their understanding and motivation to adhere to these recommendations.       Elevated LDL cholesterol level    Continue diet focus at this time and recheck lipid panel today -- fasting.  ASCVD 1.3%.      Relevant Orders   Lipid Panel w/o Chol/HDL Ratio   Long-term current use of benzodiazepine    Obtain UDS next visit.  Discussed at length risk of long term benzo use, at this time she uses appropriately, minimally.  Recommend continued minimal use.        Follow up plan: Return in about 6 months (around 12/31/2021) for Annual physical.

## 2021-06-30 NOTE — Assessment & Plan Note (Signed)
Obtain UDS next visit.  Discussed at length risk of long term benzo use, at this time she uses appropriately, minimally.  Recommend continued minimal use.

## 2021-06-30 NOTE — Assessment & Plan Note (Signed)
Chronic, stable.  Continue current Levothyroxine dose and adjusted as needed.  Labs today. 

## 2021-06-30 NOTE — Assessment & Plan Note (Signed)
Chronic, stable. Continue therapeutic exercises learned in therapy and PRN clonazepam as needed for acute events. To notify provider of increase in anxious mood or increased need for medication use.  Refills up to date.  Obtained UDS up to date, contract up to date. Follow-up in 6 months.

## 2021-06-30 NOTE — Assessment & Plan Note (Signed)
BMI 34.72.  Recommended eating smaller high protein, low fat meals more frequently and exercising 30 mins a day 5 times a week with a goal of 10-15lb weight loss in the next 3 months. Patient voiced their understanding and motivation to adhere to these recommendations.  

## 2021-06-30 NOTE — Assessment & Plan Note (Signed)
Chronic, stable. To continue current medication regimen as prescribed. To notify provider with change in symptoms or increased use/need of rescue inhaler. Follow-up in 6 months for annual physical.

## 2021-06-30 NOTE — Assessment & Plan Note (Signed)
Continue diet focus at this time and recheck lipid panel today -- fasting.  ASCVD 1.3%.

## 2021-07-01 ENCOUNTER — Other Ambulatory Visit: Payer: Self-pay | Admitting: Nurse Practitioner

## 2021-07-01 ENCOUNTER — Ambulatory Visit: Payer: Self-pay | Admitting: *Deleted

## 2021-07-01 ENCOUNTER — Encounter: Payer: Self-pay | Admitting: Nurse Practitioner

## 2021-07-01 LAB — LIPID PANEL W/O CHOL/HDL RATIO
Cholesterol, Total: 213 mg/dL — ABNORMAL HIGH (ref 100–199)
HDL: 66 mg/dL (ref >39)
LDL Chol Calc (NIH): 125 mg/dL — ABNORMAL HIGH (ref 0–99)
Triglycerides: 124 mg/dL (ref 0–149)
VLDL Cholesterol Cal: 22 mg/dL (ref 5–40)

## 2021-07-01 LAB — BASIC METABOLIC PANEL
BUN/Creatinine Ratio: 11 (ref 9–23)
BUN: 9 mg/dL (ref 6–24)
CO2: 18 mmol/L — ABNORMAL LOW (ref 20–29)
Calcium: 8.9 mg/dL (ref 8.7–10.2)
Chloride: 102 mmol/L (ref 96–106)
Creatinine, Ser: 0.8 mg/dL (ref 0.57–1.00)
Glucose: 82 mg/dL (ref 65–99)
Potassium: 4.5 mmol/L (ref 3.5–5.2)
Sodium: 136 mmol/L (ref 134–144)
eGFR: 90 mL/min/{1.73_m2} (ref >59)

## 2021-07-01 LAB — TSH: TSH: 1.46 u[IU]/mL (ref 0.450–4.500)

## 2021-07-01 LAB — T4, FREE: Free T4: 1.14 ng/dL (ref 0.82–1.77)

## 2021-07-01 LAB — THYROID PEROXIDASE ANTIBODY: Thyroperoxidase Ab SerPl-aCnc: 84 IU/mL — ABNORMAL HIGH (ref 0–34)

## 2021-07-01 NOTE — Progress Notes (Signed)
Good afternoon, please let Laurie Martinez know her labs have returned.  Thyroid labs continue in normal range, can continue current dosing.  Thyroid antibody was a little elevated, most likely your hypothyroid is Hashimoto's, which we treat the same.  Definitely something to read up on.  Kidney function remains stable.  Cholesterol levels remain elevated, but recommend continue diet focus.  Any questions? Keep being stellar!!  Thank you for allowing me to participate in your care.  I appreciate you. Kindest regards, Yazleemar Strassner

## 2021-07-01 NOTE — Telephone Encounter (Signed)
Pt given lab results per notes of J. Harvest Dark, NP on 07/01/21. Pt verbalized understanding. Patent requesting to review lipid panel results. No further questions.

## 2021-08-02 ENCOUNTER — Other Ambulatory Visit: Payer: Self-pay | Admitting: Nurse Practitioner

## 2021-08-02 DIAGNOSIS — Z1231 Encounter for screening mammogram for malignant neoplasm of breast: Secondary | ICD-10-CM

## 2021-08-17 ENCOUNTER — Other Ambulatory Visit: Payer: Self-pay

## 2021-08-17 ENCOUNTER — Other Ambulatory Visit: Payer: Self-pay | Admitting: Nurse Practitioner

## 2021-08-17 ENCOUNTER — Ambulatory Visit
Admission: RE | Admit: 2021-08-17 | Discharge: 2021-08-17 | Disposition: A | Discharge status: Home / Self Care | Payer: Managed Care, Other (non HMO) | Source: Ambulatory Visit | Attending: Nurse Practitioner | Admitting: Nurse Practitioner

## 2021-08-17 DIAGNOSIS — Z1231 Encounter for screening mammogram for malignant neoplasm of breast: Secondary | ICD-10-CM | POA: Diagnosis not present

## 2021-08-17 NOTE — Telephone Encounter (Signed)
Requested Prescriptions  Pending Prescriptions Disp Refills  . ISIBLOOM 0.15-30 MG-MCG tablet [Pharmacy Med Name: ISIBLOOM 0.15-30 MG-MCG TAB] 84 tablet 0    Sig: TAKE 1 TABLET BY MOUTH ONCE DAILY     OB/GYN:  Contraceptives Passed - 08/17/2021  8:13 AM      Passed - Last BP in normal range    BP Readings from Last 1 Encounters:  06/30/21 126/79         Passed - Valid encounter within last 12 months    Recent Outpatient Visits          1 month ago Mild episode of recurrent major depressive disorder (HCC)   [facility] Cannady, Jolene T, NP   7 months ago Mild episode of recurrent major depressive disorder (HCC)   [facility] Cannady, Jolene T, NP   1 year ago Mild episode of recurrent major depressive disorder (HCC)   [facility] Cannady, Dorie Rank, NP   1 year ago Annual physical exam   [facility] [place], Corrie Dandy T, NP   1 year ago Depression, unspecified depression type   [facility] [place], Dorie Rank, NP      Future Appointments            In 4 months Cannady, Dorie Rank, NP [facility]

## 2021-08-21 NOTE — Progress Notes (Signed)
Good day, your mammogram has returned normal.  Do not need to repeat for one year.  Great news!! ?Keep being amazing!!  Thank you for allowing me to participate in your care.  I appreciate you. ?Kindest regards, ?Aztlan Coll ?

## 2021-10-27 ENCOUNTER — Other Ambulatory Visit: Payer: Self-pay | Admitting: Nurse Practitioner

## 2021-10-27 NOTE — Telephone Encounter (Signed)
Requested Prescriptions  Pending Prescriptions Disp Refills   ISIBLOOM 0.15-30 MG-MCG tablet [Pharmacy Med Name: ISIBLOOM 0.15-30 MG-MCG TAB] 84 tablet 0    Sig: TAKE 1 TABLET BY MOUTH ONCE DAILY     OB/GYN:  Contraceptives Passed - 10/27/2021  3:00 PM      Passed - Last BP in normal range    BP Readings from Last 1 Encounters:  06/30/21 126/79         Passed - Valid encounter within last 12 months    Recent Outpatient Visits          3 months ago Mild episode of recurrent major depressive disorder (HCC)   [facility] Cannady, Jolene T, NP   10 months ago Mild episode of recurrent major depressive disorder (HCC)   [facility] Cannady, Jolene T, NP   1 year ago Mild episode of recurrent major depressive disorder (HCC)   [facility] Cannady, Dorie Rank, NP   1 year ago Annual physical exam   [facility] [place], Corrie Dandy T, NP   2 years ago Depression, unspecified depression type   [facility] [place], Dorie Rank, NP      Future Appointments            In 2 months Cannady, Dorie Rank, NP [facility]

## 2021-11-09 ENCOUNTER — Other Ambulatory Visit: Payer: Self-pay | Admitting: Nurse Practitioner

## 2021-11-10 NOTE — Telephone Encounter (Signed)
Requested Prescriptions  Pending Prescriptions Disp Refills   levothyroxine (SYNTHROID) 50 MCG tablet [Pharmacy Med Name: LEVOTHYROXINE SODIUM 50 MCG TAB] 90 tablet 2    Sig: TAKE 1 TABLET BY MOUTH ONCE DAILY BEFOREBREAKFAST     Endocrinology:  Hypothyroid Agents Failed - 11/09/2021  2:33 PM      Failed - TSH needs to be rechecked within 3 months after an abnormal result. Refill until TSH is due.      Passed - TSH in normal range and within 360 days    TSH  Date Value Ref Range Status  06/30/2021 1.460 0.450 - 4.500 uIU/mL Final         Passed - Valid encounter within last 12 months    Recent Outpatient Visits          4 months ago Mild episode of recurrent major depressive disorder (HCC)   [facility] Cannady, Jolene T, NP   10 months ago Mild episode of recurrent major depressive disorder (HCC)   [facility] Cannady, Jolene T, NP   1 year ago Mild episode of recurrent major depressive disorder (HCC)   [facility] Cannady, Dorie Rank, NP   1 year ago Annual physical exam   [facility] [place], Corrie Dandy T, NP   2 years ago Depression, unspecified depression type   [facility] [place], Dorie Rank, NP      Future Appointments            In 1 month Cannady, Dorie Rank, NP [facility]

## 2021-12-06 ENCOUNTER — Other Ambulatory Visit: Payer: Self-pay | Admitting: Nurse Practitioner

## 2021-12-06 NOTE — Telephone Encounter (Signed)
Requested Prescriptions  Pending Prescriptions Disp Refills   montelukast (SINGULAIR) 10 MG tablet [Pharmacy Med Name: MONTELUKAST SODIUM 10 MG TAB] 90 tablet 0    Sig: TAKE 1 TABLET BY MOUTH ONCE EVERY EVENING     Pulmonology:  Leukotriene Inhibitors Passed - 12/06/2021 10:15 AM      Passed - Valid encounter within last 12 months    Recent Outpatient Visits          5 months ago Mild episode of recurrent major depressive disorder (HCC)   [facility] Cannady, Jolene T, NP   11 months ago Mild episode of recurrent major depressive disorder (HCC)   [facility] Cannady, Jolene T, NP   1 year ago Mild episode of recurrent major depressive disorder (HCC)   [facility] Cannady, Dorie Rank, NP   1 year ago Annual physical exam   [facility] [place], Corrie Dandy T, NP   2 years ago Depression, unspecified depression type   [facility] [place], Dorie Rank, NP      Future Appointments            In 3 weeks Cannady, Dorie Rank, NP [facility]

## 2022-01-01 NOTE — Patient Instructions (Addendum)
Pepcid for heart burn.  Healthy Eating Following a healthy eating pattern may help you to achieve and maintain a healthy body weight, reduce the risk of chronic disease, and live a long and productive life. It is important to follow a healthy eating pattern at an appropriate calorie level for your body. Your nutritional needs should be met primarily through food by choosing a variety of nutrient-rich foods. What are tips for following this plan? Reading food labels Read labels and choose the following: Reduced or low sodium. Juices with 100% fruit juice. Foods with low saturated fats and high polyunsaturated and monounsaturated fats. Foods with whole grains, such as whole wheat, cracked wheat, brown rice, and wild rice. Whole grains that are fortified with folic acid. This is recommended for women who are pregnant or who want to become pregnant. Read labels and avoid the following: Foods with a lot of added sugars. These include foods that contain brown sugar, corn sweetener, corn syrup, dextrose, fructose, glucose, high-fructose corn syrup, honey, invert sugar, lactose, malt syrup, maltose, molasses, raw sugar, sucrose, trehalose, or turbinado sugar. Do not eat more than the following amounts of added sugar per day: 6 teaspoons (25 g) for women. 9 teaspoons (38 g) for men. Foods that contain processed or refined starches and grains. Refined grain products, such as white flour, degermed cornmeal, white bread, and white rice. Shopping Choose nutrient-rich snacks, such as vegetables, whole fruits, and nuts. Avoid high-calorie and high-sugar snacks, such as potato chips, fruit snacks, and candy. Use oil-based dressings and spreads on foods instead of solid fats such as butter, stick margarine, or cream cheese. Limit pre-made sauces, mixes, and "instant" products such as flavored rice, instant noodles, and ready-made pasta. Try more plant-protein sources, such as tofu, tempeh, black beans,  edamame, lentils, nuts, and seeds. Explore eating plans such as the Mediterranean diet or vegetarian diet. Cooking Use oil to saut or stir-fry foods instead of solid fats such as butter, stick margarine, or lard. Try baking, boiling, grilling, or broiling instead of frying. Remove the fatty part of meats before cooking. Steam vegetables in water or broth. Meal planning  At meals, imagine dividing your plate into fourths: One-half of your plate is fruits and vegetables. One-fourth of your plate is whole grains. One-fourth of your plate is protein, especially lean meats, poultry, eggs, tofu, beans, or nuts. Include low-fat dairy as part of your daily diet. Lifestyle Choose healthy options in all settings, including home, work, school, restaurants, or stores. Prepare your food safely: Wash your hands after handling raw meats. Keep food preparation surfaces clean by regularly washing with hot, soapy water. Keep raw meats separate from ready-to-eat foods, such as fruits and vegetables. Cook seafood, meat, poultry, and eggs to the recommended internal temperature. Store foods at safe temperatures. In general: Keep cold foods at 21F (4.4C) or below. Keep hot foods at 121F (60C) or above. Keep your freezer at [facility] (-17.8C) or below. Foods are no longer safe to eat when they have been between the temperatures of 40-121F (4.4-60C) for more than 2 hours. What foods should I eat? Fruits Aim to eat 2 cup-equivalents of fresh, canned (in natural juice), or frozen fruits each day. Examples of 1 cup-equivalent of fruit include 1 small apple, 8 large strawberries, 1 cup canned fruit,  cup dried fruit, or 1 cup 100% juice. Vegetables Aim to eat 2-3 cup-equivalents of fresh and frozen vegetables each day, including different varieties and colors. Examples of 1 cup-equivalent of vegetables include 2  medium carrots, 2 cups raw, leafy greens, 1 cup chopped vegetable (raw or cooked), or 1 medium  baked potato. Grains Aim to eat 6 ounce-equivalents of whole grains each day. Examples of 1 ounce-equivalent of grains include 1 slice of bread, 1 cup ready-to-eat cereal, 3 cups popcorn, or  cup cooked rice, pasta, or cereal. Meats and other proteins Aim to eat 5-6 ounce-equivalents of protein each day. Examples of 1 ounce-equivalent of protein include 1 egg, 1/2 cup nuts or seeds, or 1 tablespoon (16 g) peanut butter. A cut of meat or fish that is the size of a deck of cards is about 3-4 ounce-equivalents. Of the protein you eat each week, try to have at least 8 ounces come from seafood. This includes salmon, trout, herring, and anchovies. Dairy Aim to eat 3 cup-equivalents of fat-free or low-fat dairy each day. Examples of 1 cup-equivalent of dairy include 1 cup (240 mL) milk, 8 ounces (250 g) yogurt, 1 ounces (44 g) natural cheese, or 1 cup (240 mL) fortified soy milk. Fats and oils Aim for about 5 teaspoons (21 g) per day. Choose monounsaturated fats, such as canola and olive oils, avocados, peanut butter, and most nuts, or polyunsaturated fats, such as sunflower, corn, and soybean oils, walnuts, pine nuts, sesame seeds, sunflower seeds, and flaxseed. Beverages Aim for six 8-oz glasses of water per day. Limit coffee to three to five 8-oz cups per day. Limit caffeinated beverages that have added calories, such as soda and energy drinks. Limit alcohol intake to no more than 1 drink a day for nonpregnant women and 2 drinks a day for men. One drink equals 12 oz of beer (355 mL), 5 oz of wine (148 mL), or 1 oz of hard liquor (44 mL). Seasoning and other foods Avoid adding excess amounts of salt to your foods. Try flavoring foods with herbs and spices instead of salt. Avoid adding sugar to foods. Try using oil-based dressings, sauces, and spreads instead of solid fats. This information is based on general [place] nutrition guidelines. For more information, visit [employer]. Exact amounts may  vary based on your nutrition needs. Summary A healthy eating plan may help you to maintain a healthy weight, reduce the risk of chronic diseases, and stay active throughout your life. Plan your meals. Make sure you eat the right portions of a variety of nutrient-rich foods. Try baking, boiling, grilling, or broiling instead of frying. Choose healthy options in all settings, including home, work, school, restaurants, or stores. This information is not intended to replace advice given to you by your health care provider. Make sure you discuss any questions you have with your health care provider. Document Revised: 06/21/2021 Document Reviewed: 06/21/2021 [employer] Patient Education  [place].

## 2022-01-02 ENCOUNTER — Ambulatory Visit (INDEPENDENT_AMBULATORY_CARE_PROVIDER_SITE_OTHER): Payer: Managed Care, Other (non HMO) | Admitting: Nurse Practitioner

## 2022-01-02 ENCOUNTER — Encounter: Payer: Self-pay | Admitting: Nurse Practitioner

## 2022-01-02 ENCOUNTER — Other Ambulatory Visit: Payer: Self-pay

## 2022-01-02 VITALS — BP 120/75 | HR 89 | Temp 98.3°F | Ht 63.5 in | Wt 190.0 lb

## 2022-01-02 DIAGNOSIS — E6609 Other obesity due to excess calories: Secondary | ICD-10-CM

## 2022-01-02 DIAGNOSIS — E063 Autoimmune thyroiditis: Secondary | ICD-10-CM

## 2022-01-02 DIAGNOSIS — E7800 Pure hypercholesterolemia, unspecified: Secondary | ICD-10-CM

## 2022-01-02 DIAGNOSIS — Z0000 Encounter for general adult medical examination without abnormal findings: Secondary | ICD-10-CM

## 2022-01-02 DIAGNOSIS — I10 Essential (primary) hypertension: Secondary | ICD-10-CM

## 2022-01-02 DIAGNOSIS — Z6834 Body mass index (BMI) 34.0-34.9, adult: Secondary | ICD-10-CM

## 2022-01-02 DIAGNOSIS — F330 Major depressive disorder, recurrent, mild: Secondary | ICD-10-CM

## 2022-01-02 DIAGNOSIS — F411 Generalized anxiety disorder: Secondary | ICD-10-CM

## 2022-01-02 DIAGNOSIS — E559 Vitamin D deficiency, unspecified: Secondary | ICD-10-CM

## 2022-01-02 DIAGNOSIS — R801 Persistent proteinuria, unspecified: Secondary | ICD-10-CM

## 2022-01-02 DIAGNOSIS — G43709 Chronic migraine without aura, not intractable, without status migrainosus: Secondary | ICD-10-CM

## 2022-01-02 DIAGNOSIS — F5101 Primary insomnia: Secondary | ICD-10-CM

## 2022-01-02 DIAGNOSIS — R7301 Impaired fasting glucose: Secondary | ICD-10-CM

## 2022-01-02 DIAGNOSIS — Z79899 Other long term (current) drug therapy: Secondary | ICD-10-CM

## 2022-01-02 DIAGNOSIS — J4520 Mild intermittent asthma, uncomplicated: Secondary | ICD-10-CM

## 2022-01-02 LAB — MICROALBUMIN, URINE WAIVED
Creatinine, Urine Waived: 100 mg/dL (ref 10–300)
Microalb, Ur Waived: 30 mg/L — ABNORMAL HIGH (ref 0–19)
Microalb/Creat Ratio: <30 mg/g (ref <30)

## 2022-01-02 LAB — BAYER DCA HB A1C WAIVED: HB A1C (BAYER DCA - WAIVED): 5.4 % (ref 4.8–5.6)

## 2022-01-02 MED ORDER — ZOLPIDEM TARTRATE 10 MG PO TABS: ORAL_TABLET | ORAL | 2 refills | Status: DC

## 2022-01-02 MED ORDER — DESOGESTREL-ETHINYL ESTRADIOL 0.15-30 MG-MCG PO TABS: 1.0000 | ORAL_TABLET | Freq: Every day | ORAL | 4 refills | Status: DC

## 2022-01-02 MED ORDER — MONTELUKAST SODIUM 10 MG PO TABS: ORAL_TABLET | ORAL | 4 refills | Status: DC

## 2022-01-02 MED ORDER — LEVOTHYROXINE SODIUM 50 MCG PO TABS: ORAL_TABLET | ORAL | 4 refills | Status: DC

## 2022-01-02 MED ORDER — LISINOPRIL 5 MG PO TABS: 5.0000 mg | ORAL_TABLET | Freq: Every day | ORAL | 4 refills | Status: DC

## 2022-01-02 MED ORDER — CLONAZEPAM 0.5 MG PO TABS: ORAL_TABLET | ORAL | 2 refills | Status: DC

## 2022-01-02 NOTE — Assessment & Plan Note (Signed)
Chronic, stable. Continue therapeutic exercises learned in therapy and PRN clonazepam as needed for acute events. To notify provider of increase in anxious mood or increased need for medication use.  Refills sent in today.  Obtained UDS today, contract up to date. Follow-up in 6 months.

## 2022-01-02 NOTE — Assessment & Plan Note (Signed)
Chronic, stable with intermittent Ambien use.  Discussed sleep hygiene methods that may benefit her. Contract up to date for controlled substance and UDS updated today.  Refills sent up to date.  Return in 6 months.

## 2022-01-02 NOTE — Assessment & Plan Note (Signed)
Continue diet focus at this time and recheck lipid panel today -- fasting.  ASCVD 1.3%. 

## 2022-01-02 NOTE — Progress Notes (Signed)
BP 120/75    Pulse 89    Temp 98.3 F (36.8 C) (Oral)    Ht 5' 3.5" (1.613 m)    Wt 190 lb (86.2 kg)    SpO2 99%    BMI 33.13 kg/m    Subjective:    Patient ID: Laurie Martinez, female    DOB: December 05, 1970, 51 y.o.   MRN: [id number]  HPI: Laurie Martinez is a 51 y.o. female presenting on 01/02/2022 for comprehensive medical examination. Current medical complaints include:none  She currently lives with: sister Menopausal Symptoms: no   HYPERTENSION/HLD Continues Lisinopril 5 MG daily. Hypertension status: controlled  Satisfied with current treatment? yes Duration of hypertension: chronic BP monitoring frequency:  weekly BP range: <130/80 on average BP medication side effects:  no Medication compliance: good compliance Aspirin: no Recurrent headaches: chronic migraine headaches, not having as often due to BCP Visual changes: no Palpitations: no Dyspnea: no Chest pain: no Lower extremity edema: no Dizzy/lightheaded: no  The 10-year ASCVD risk score (Arnett DK, et al., 2019) is: 1.3%   Values used to calculate the score:     Age: 48 years     Sex: Female     Is Non-Hispanic African American: No     Diabetic: No     Tobacco smoker: No     Systolic Blood Pressure: 956 mmHg     Is BP treated: Yes     HDL Cholesterol: 66 mg/dL     Total Cholesterol: 213 mg/dL   HYPOTHYROIDISM Continues on Levothyroxine 50 MCG daily. Thyroid control status:controlled Satisfied with current treatment? yes Medication side effects: no Medication compliance: good compliance Recent dose adjustment:no Fatigue: no Cold intolerance: at times Heat intolerance: no Weight gain: no Weight loss: yes -- expected Constipation: yes- chronic issue- taking Miralax Diarrhea/loose stools: no Palpitations: no Lower extremity edema: no Anxiety/depressed mood: yes - chronic issue- no change in baseline   ASTHMA Continues Singulair and Advair daily and Ventolin PRN. Is seasonal and exercise induced. Asthma  status: controlled Satisfied with current treatment?: yes Albuterol/rescue inhaler frequency: not as much this winter, last use 2 weeks ago Dyspnea frequency: rare Wheezing frequency: rare Cough frequency: rare Nocturnal symptom frequency: none Limitation of activity: none Current upper respiratory symptoms: no Triggers: seasonal allergies in the spring and fall  Visits to ER or Urgent Care in past year: no Pneumovax: Up to Date Influenza: Not up to Date - patient declines the flu vaccine   DEPRESSION, ANXIETY, & INSOMNIA Continues Ambien and Klonopin. Pt reports satisfaction with current treatment options and wishes to continue. Pt is aware of risks of benzo medication use to include increased sedation, respiratory suppression, falls, dependence and cardiovascular events. Pt would like to continue treatment as benefit determined to outweigh risk.   At baseline Klonopin use less than once a month as needed for severe symptoms, has been using a little more lately due to stressors -- 4 days a week.  Reports difficulty sleeping most nights, but reserves use of Ambien for weekends and when off work.  At this time is not attending therapy, as therapist retired. Reports depression symptoms are worse in the winter. On review PDMP last Ambien fill 07/10/21 and Klonopin 08/20/21. Mood status: controlled Satisfied with current treatment?: yes Symptom severity: moderate  Duration of current treatment : chronic Side effects: no Medication compliance: good compliance Psychotherapy/counseling: yes in the past Previous psychiatric medications: pt reports "several" medications > 5 years ago, but cannot recall the names Depressed mood:  yes Anxious mood:yes Anhedonia: no Significant weight loss or gain: no Insomnia: yes Fatigue: yes Feelings of worthlessness or guilt: no Impaired concentration/indecisiveness: occasional Suicidal ideations: no Hopelessness: no Crying spells: no  Depression Screen  done today and results listed below:  Depression screen [facility] 2/9 01/02/2022 06/30/2021 12/30/2020 06/25/2020 12/23/2019  Decreased Interest 2 0 _0 Down, Depressed, Hopeless 2 0 _1 PHQ - 2 Score 4 0 _2 Altered sleeping _3 Tired, decreased energy _4 Change in appetite 0 0 _5 Feeling bad or failure about yourself  0 0 0 0 0  Trouble concentrating _6 Moving slowly or fidgety/restless 1 0 2 0 0  Suicidal thoughts 0 0 0 0 0  PHQ-9 Score _7 Difficult doing work/chores Somewhat difficult Somewhat difficult Not difficult at all - Not difficult at all  Some recent data might be hidden   GAD 7 : Generalized Anxiety Score 01/02/2022 12/30/2020 06/25/2020 12/23/2019  Nervous, Anxious, on Edge _8 Control/stop worrying _9 Worry too much - different things 0 _10 Trouble relaxing 2 0 2 1  Restless 0 0 1 1  Easily annoyed or irritable _11 Afraid - awful might happen 0 0 0 0  Total GAD 7 Score _12 Anxiety Difficulty Not difficult at all Not difficult at all Somewhat difficult Not difficult at all   The patient does not have a history of falls. I did not complete a risk assessment for falls. A plan of care for falls was not documented.   Past Medical History:  Past Medical History:  Diagnosis Date   Asthma    Constipation    Depression    Hypertension    Insomnia     Surgical History:  History reviewed. No pertinent surgical history.  Medications:  Current Outpatient Medications on File Prior to Visit  Medication Sig   ADVAIR DISKUS 250-50 MCG/ACT AEPB INHALE 1 PUFF BY MOUTH TWICE DAILY. RINSE MOUTH AFTER USE.   hydrocortisone (ANUSOL-HC) 2.5 % rectal cream Place 1 application rectally 2 (two) times daily.   PROAIR HFA 108 (90 Base) MCG/ACT inhaler INHALE 2 PUFFS BY MOUTH EVERY 4 HOURS ASNEEDED WHEEZING/ SHORTNESS OFBREATH   SUMAtriptan (IMITREX) 20 MG/ACT nasal spray Place 1 spray (20 mg total) into the nose  once for 1 dose. May repeat in 2 hours if headache persists or recurs.   No current facility-administered medications on file prior to visit.    Allergies:  No Known Allergies  Social History:  Social History   Socioeconomic History   Marital status: Single    Spouse name: Not on file   Number of children: Not on file   Years of education: Not on file   Highest education level: Not on file  Occupational History   Not on file  Tobacco Use   Smoking status: Never   Smokeless tobacco: Never  Vaping Use   Vaping Use: Never used  Substance and Sexual Activity   Alcohol use: No    Alcohol/week: 0.0 standard drinks   Drug use: No   Sexual activity: Never  Other Topics Concern   Not on file  Social History Narrative   Not on file   Social Determinants of Health   Financial Resource Strain: Not  on file  Food Insecurity: Not on file  Transportation Needs: Not on file  Physical Activity: Not on file  Stress: Not on file  Social Connections: Not on file  Intimate Partner Violence: Not on file   Social History   Tobacco Use  Smoking Status Never  Smokeless Tobacco Never   Social History   Substance and Sexual Activity  Alcohol Use No   Alcohol/week: 0.0 standard drinks    Family History:  Family History  Problem Relation Age of Onset   Depression Mother    Migraines Sister    Breast cancer Maternal Grandmother     Past medical history, surgical history, medications, allergies, family history and social history reviewed with patient today and changes made to appropriate areas of the chart.   Review of Systems - negative All other ROS negative except what is listed above and in the HPI.      Objective:    BP 120/75    Pulse 89    Temp 98.3 F (36.8 C) (Oral)    Ht 5' 3.5" (1.613 m)    Wt 190 lb (86.2 kg)    SpO2 99%    BMI 33.13 kg/m   Wt Readings from Last 3 Encounters:  01/02/22 190 lb (86.2 kg)  06/30/21 196 lb (88.9 kg)  12/30/20  190 lb 3.2 oz (86.3 kg)    Physical Exam Vitals and nursing note reviewed. Exam conducted with a chaperone present.  Constitutional:      General: She is awake. She is not in acute distress.    Appearance: She is well-developed. She is not ill-appearing.  HENT:     Head: Normocephalic and atraumatic.     Right Ear: Hearing, tympanic membrane, ear canal and external ear normal. No drainage.     Left Ear: Hearing, tympanic membrane, ear canal and external ear normal. No drainage.     Nose: Nose normal.     Right Sinus: No maxillary sinus tenderness or frontal sinus tenderness.     Left Sinus: No maxillary sinus tenderness or frontal sinus tenderness.     Mouth/Throat:     Mouth: Mucous membranes are moist.     Pharynx: Oropharynx is clear. Uvula midline. No pharyngeal swelling, oropharyngeal exudate or posterior oropharyngeal erythema.  Eyes:     General: Lids are normal.        Right eye: No discharge.        Left eye: No discharge.     Extraocular Movements: Extraocular movements intact.     Conjunctiva/sclera: Conjunctivae normal.     Pupils: Pupils are equal, round, and reactive to light.     Visual Fields: Right eye visual fields normal and left eye visual fields normal.  Neck:     Thyroid: No thyromegaly.     Vascular: No carotid bruit.     Trachea: Trachea normal.  Cardiovascular:     Rate and Rhythm: Normal rate and regular rhythm.     Heart sounds: Normal heart sounds. No murmur heard.   No gallop.  Pulmonary:     Effort: Pulmonary effort is normal. No accessory muscle usage or respiratory distress.     Breath sounds: Normal breath sounds.  Chest:  Breasts:    Right: Normal.     Left: Normal.  Abdominal:     General: Bowel sounds are normal.     Palpations: Abdomen is soft. There is no hepatomegaly or splenomegaly.     Tenderness: There is no abdominal tenderness.  Musculoskeletal:        General: Normal range of motion.     Cervical back: Normal range of motion  and neck supple.     Right lower leg: No edema.     Left lower leg: No edema.  Lymphadenopathy:     Head:     Right side of head: No submental, submandibular, tonsillar, preauricular or posterior auricular adenopathy.     Left side of head: No submental, submandibular, tonsillar, preauricular or posterior auricular adenopathy.     Cervical: No cervical adenopathy.     Upper Body:     Right upper body: No supraclavicular, axillary or pectoral adenopathy.     Left upper body: No supraclavicular, axillary or pectoral adenopathy.  Skin:    General: Skin is warm and dry.     Capillary Refill: Capillary refill takes less than 2 seconds.     Findings: No rash.  Neurological:     Mental Status: She is alert and oriented to person, place, and time.     Cranial Nerves: Cranial nerves 2-12 are intact.     Gait: Gait is intact.     Deep Tendon Reflexes: Reflexes are normal and symmetric.     Reflex Scores:      Brachioradialis reflexes are 2+ on the right side and 2+ on the left side.      Patellar reflexes are 2+ on the right side and 2+ on the left side. Psychiatric:        Attention and Perception: Attention normal.        Mood and Affect: Mood normal.        Speech: Speech normal.        Behavior: Behavior normal. Behavior is cooperative.        Thought Content: Thought content normal.   Results for orders placed or performed in visit on 06/30/21  T4, free  Result Value Ref Range   Free T4 1.14 0.82 - 1.77 ng/dL  Thyroid peroxidase antibody  Result Value Ref Range   Thyroperoxidase Ab SerPl-aCnc 84 (H) 0 - 34 IU/mL  TSH  Result Value Ref Range   TSH 1.460 0.450 - 4.500 uIU/mL  Lipid Panel w/o Chol/HDL Ratio  Result Value Ref Range   Cholesterol, Total 213 (H) 100 - 199 mg/dL   Triglycerides 124 0 - 149 mg/dL   HDL 66 >39 mg/dL   VLDL Cholesterol Cal 22 5 - 40 mg/dL   LDL Chol Calc (NIH) 125 (H) 0 - 99 mg/dL  Basic metabolic panel  Result Value Ref Range   Glucose 82 65 - 99  mg/dL   BUN 9 6 - 24 mg/dL   Creatinine, Ser 0.80 0.57 - 1.00 mg/dL   eGFR 90 >59 mL/min/1.73   BUN/Creatinine Ratio 11 9 - 23   Sodium 136 134 - 144 mmol/L   Potassium 4.5 3.5 - 5.2 mmol/L   Chloride 102 96 - 106 mmol/L   CO2 18 (L) 20 - 29 mmol/L   Calcium 8.9 8.7 - 10.2 mg/dL      Assessment & Plan:   Problem List Items Addressed This Visit       Cardiovascular and Mediastinum   Hypertension    Chronic, stable with BP at goal in office and at home.  Continue current medication regimen and adjust as needed.  Recommend she monitor BP at least a few mornings a week at home and document.  DASH diet at home.  Refills sent.  Labs today: CBC,  CMP, lipid, urine ALB.  Return in 6 months.       Relevant Medications   lisinopril (ZESTRIL) 5 MG tablet   Other Relevant Orders   CBC with Differential/Platelet   Comprehensive metabolic panel   Migraines    Chronic, stable. Continue desogestrel-ethinyl estradiol continuous for hormonal maintenance. Continue use of sumatriptan as prescribed PRN for migraine headaches. Discussed the option of continuing continuous birth control up to age 58 for migraine prevention or allowing a one week break every month for menstrual cycle. Pt opted for continual birth control use at this time. Educated on the importance of annual mammograms with this option. Pt to notify provider with any changes in headaches, frequency, or severity.       Relevant Medications   clonazePAM (KLONOPIN) 0.5 MG tablet   lisinopril (ZESTRIL) 5 MG tablet   Other Relevant Orders   CBC with Differential/Platelet   Comprehensive metabolic panel     Respiratory   Asthma    Chronic, stable. To continue current medication regimen as prescribed. To notify provider with change in symptoms or increased use/need of rescue inhaler. Follow-up in 6 months.      Relevant Medications   montelukast (SINGULAIR) 10 MG tablet   Other Relevant Orders   CBC with Differential/Platelet      Endocrine   Hashimoto's thyroiditis    Chronic, stable.  Continue current Levothyroxine dose and adjusted as needed.  Labs today -- TSH and Free T4.  Discussed Hashimoto's diet changes + magnesium/selenium daily as supplements.  Pepcid for heart burn.  Discussed ultrasound, she declines at this time.      Relevant Medications   levothyroxine (SYNTHROID) 50 MCG tablet   Other Relevant Orders   TSH   T4, free     Other   Depression - Primary    Chronic, stable. Continue therapeutic exercises learned in therapy and PRN clonazepam as needed for acute events. To notify provider of increase in anxious mood or increased need for medication use.  Refills sent in today.  Obtained UDS today, contract up to date. Follow-up in 6 months.      Relevant Orders   [id number] 11+Oxyco+Alc+Crt-Bund   Elevated LDL cholesterol level    Continue diet focus at this time and recheck lipid panel today -- fasting.  ASCVD 1.3%.      Relevant Orders   Comprehensive metabolic panel   Lipid Panel w/o Chol/HDL Ratio   Generalized anxiety disorder    Chronic, stable with intermittent use of Klonopin. Refills up to date.  UDS up to date and controlled substance on file.  Return in 6 months.      Relevant Orders   [id number] 11+Oxyco+Alc+Crt-Bund   Insomnia    Chronic, stable with intermittent Ambien use.  Discussed sleep hygiene methods that may benefit her. Contract up to date for controlled substance and UDS updated today.  Refills sent up to date.  Return in 6 months.      Relevant Orders   [id number] 11+Oxyco+Alc+Crt-Bund   Long-term current use of benzodiazepine    Obtain UDS today.  Discussed at length risk of long term benzo use, at this time she uses appropriately, minimally.  Recommend continued minimal use.      Relevant Orders   [id number] 11+Oxyco+Alc+Crt-Bund   Obesity    BMI 33.13.  Recommended eating smaller high protein, low fat meals more frequently and exercising 30 mins a day 5 times a week with a goal  of 10-15lb weight loss in the  next 3 months. Patient voiced their understanding and motivation to adhere to these recommendations.       Relevant Orders   Bayer DCA Hb A1c Waived   Proteinuria    With HTN -- urine ALB 80 last check -- recheck today.  Continue Lisinopril for kidney protection and check CMP.        Relevant Orders   Microalbumin, Urine Waived   Vitamin D deficiency    Reports history of low level, check today and continue daily supplement, adjust as needed.      Relevant Orders   VITAMIN D 25 Hydroxy (Vit-D Deficiency, Fractures)   Other Visit Diagnoses     IFG (impaired fasting glucose)       Relevant Orders   Bayer Hurt Hb A1c Waived   Encounter for annual physical exam       Annual physical with labs obtained and health maintenance reviewed.        Follow up plan: Return in about 6 months (around 07/02/2022) for [place].   LABORATORY TESTING:  - Pap smear: refuses  IMMUNIZATIONS:   - Tdap: Tetanus vaccination status reviewed: last tetanus booster within 10 years. - Influenza: Refused - Pneumovax: Up To Date - Prevnar: Not applicable - HPV: Not applicable - Zostavax vaccine: Not applicable - Covid vaccinations -- 1st 3 of them attained  SCREENING: -Mammogram: Up to date  - Colonoscopy: Refuses today -- had at length discussion - Bone Density: Not applicable  -Hearing Test: Not applicable  -Spirometry: Not applicable   PATIENT COUNSELING:   Advised to take 1 mg of folate supplement per day if capable of pregnancy.   Sexuality: Discussed sexually transmitted diseases, partner selection, use of condoms, avoidance of unintended pregnancy  and contraceptive alternatives.   Advised to avoid cigarette smoking.  I discussed with the patient that most people either abstain from alcohol or drink within safe limits (<=14/week and <=4 drinks/occasion for males, <=7/weeks and <= 3 drinks/occasion for females) and that the risk for alcohol  disorders and other health effects rises proportionally with the number of drinks per week and how often a drinker exceeds daily limits.  Discussed cessation/primary prevention of drug use and availability of treatment for abuse.   Diet: Encouraged to adjust caloric intake to maintain  or achieve ideal body weight, to reduce intake of dietary saturated fat and total fat, to limit sodium intake by avoiding high sodium foods and not adding table salt, and to maintain adequate dietary potassium and calcium preferably from fresh fruits, vegetables, and low-fat dairy products.    Stressed the importance of regular exercise  Injury prevention: Discussed safety belts, safety helmets, smoke detector, smoking near bedding or upholstery.   Dental health: Discussed importance of regular tooth brushing, flossing, and dental visits.    NEXT PREVENTATIVE PHYSICAL DUE IN 1 YEAR. Return in about 6 months (around 07/02/2022) for [place].

## 2022-01-02 NOTE — Assessment & Plan Note (Signed)
Chronic, stable. Continue desogestrel-ethinyl estradiol continuous for hormonal maintenance. Continue use of sumatriptan as prescribed PRN for migraine headaches. Discussed the option of continuing continuous birth control up to age 52 for migraine prevention or allowing a one week break every month for menstrual cycle. Pt opted for continual birth control use at this time. Educated on the importance of annual mammograms with this option. Pt to notify provider with any changes in headaches, frequency, or severity.  

## 2022-01-02 NOTE — Assessment & Plan Note (Signed)
Obtain UDS today.  Discussed at length risk of long term benzo use, at this time she uses appropriately, minimally.  Recommend continued minimal use.

## 2022-01-02 NOTE — Assessment & Plan Note (Signed)
Chronic, stable. To continue current medication regimen as prescribed. To notify provider with change in symptoms or increased use/need of rescue inhaler. Follow-up in 6 months. 

## 2022-01-02 NOTE — Assessment & Plan Note (Addendum)
Chronic, stable.  Continue current Levothyroxine dose and adjusted as needed.  Labs today -- TSH and Free T4.  Discussed Hashimoto's diet changes + magnesium/selenium daily as supplements.  Pepcid for heart burn.  Discussed ultrasound, she declines at this time.

## 2022-01-02 NOTE — Assessment & Plan Note (Signed)
Chronic, stable with BP at goal in office and at home.  Continue current medication regimen and adjust as needed.  Recommend she monitor BP at least a few mornings a week at home and document.  DASH diet at home.  Refills sent.  Labs today: CBC, CMP, lipid, urine ALB.  Return in 6 months.

## 2022-01-02 NOTE — Assessment & Plan Note (Addendum)
Reports history of low level, check today and continue daily supplement, adjust as needed. 

## 2022-01-02 NOTE — Assessment & Plan Note (Signed)
BMI 33.13.  Recommended eating smaller high protein, low fat meals more frequently and exercising 30 mins a day 5 times a week with a goal of 10-15lb weight loss in the next 3 months. Patient voiced their understanding and motivation to adhere to these recommendations.  

## 2022-01-02 NOTE — Assessment & Plan Note (Signed)
Chronic, stable with intermittent use of Klonopin. Refills up to date.  UDS up to date and controlled substance on file.  Return in 6 months. 

## 2022-01-02 NOTE — Assessment & Plan Note (Signed)
With HTN -- urine ALB 80 last check -- recheck today.  Continue Lisinopril for kidney protection and check CMP.   

## 2022-01-03 LAB — CBC WITH DIFFERENTIAL/PLATELET
Basophils Absolute: 0.1 10*3/uL (ref 0.0–0.2)
Basos: 1 %
EOS (ABSOLUTE): 0.2 10*3/uL (ref 0.0–0.4)
Eos: 2 %
Hematocrit: 41.1 % (ref 34.0–46.6)
Hemoglobin: 14.4 g/dL (ref 11.1–15.9)
Immature Grans (Abs): 0 10*3/uL (ref 0.0–0.1)
Immature Granulocytes: 0 %
Lymphocytes Absolute: 3 10*3/uL (ref 0.7–3.1)
Lymphs: 29 %
MCH: 33.1 pg — ABNORMAL HIGH (ref 26.6–33.0)
MCHC: 35 g/dL (ref 31.5–35.7)
MCV: 95 fL (ref 79–97)
Monocytes Absolute: 0.5 10*3/uL (ref 0.1–0.9)
Monocytes: 4 %
Neutrophils Absolute: 6.7 10*3/uL (ref 1.4–7.0)
Neutrophils: 64 %
Platelets: 316 10*3/uL (ref 150–450)
RBC: 4.35 x10E6/uL (ref 3.77–5.28)
RDW: 11.1 % — ABNORMAL LOW (ref 11.7–15.4)
WBC: 10.5 10*3/uL (ref 3.4–10.8)

## 2022-01-03 LAB — COMPREHENSIVE METABOLIC PANEL
ALT: 9 IU/L (ref 0–32)
AST: 11 IU/L (ref 0–40)
Albumin/Globulin Ratio: 1.5 (ref 1.2–2.2)
Albumin: 4.5 g/dL (ref 3.8–4.8)
Alkaline Phosphatase: 84 IU/L (ref 44–121)
BUN/Creatinine Ratio: 15 (ref 9–23)
BUN: 12 mg/dL (ref 6–24)
Bilirubin Total: <0.2 mg/dL (ref 0.0–1.2)
CO2: 18 mmol/L — ABNORMAL LOW (ref 20–29)
Calcium: 9.8 mg/dL (ref 8.7–10.2)
Chloride: 100 mmol/L (ref 96–106)
Creatinine, Ser: 0.82 mg/dL (ref 0.57–1.00)
Globulin, Total: 3.1 g/dL (ref 1.5–4.5)
Glucose: 108 mg/dL — ABNORMAL HIGH (ref 70–99)
Potassium: 4.2 mmol/L (ref 3.5–5.2)
Sodium: 137 mmol/L (ref 134–144)
Total Protein: 7.6 g/dL (ref 6.0–8.5)
eGFR: 87 mL/min/{1.73_m2} (ref >59)

## 2022-01-03 LAB — TSH: TSH: 2 u[IU]/mL (ref 0.450–4.500)

## 2022-01-03 LAB — LIPID PANEL W/O CHOL/HDL RATIO
Cholesterol, Total: 223 mg/dL — ABNORMAL HIGH (ref 100–199)
HDL: 58 mg/dL (ref >39)
LDL Chol Calc (NIH): 129 mg/dL — ABNORMAL HIGH (ref 0–99)
Triglycerides: 204 mg/dL — ABNORMAL HIGH (ref 0–149)
VLDL Cholesterol Cal: 36 mg/dL (ref 5–40)

## 2022-01-03 LAB — T4, FREE: Free T4: 1.22 ng/dL (ref 0.82–1.77)

## 2022-01-03 LAB — VITAMIN D 25 HYDROXY (VIT D DEFICIENCY, FRACTURES): Vit D, 25-Hydroxy: 72.7 ng/mL (ref 30.0–100.0)

## 2022-01-03 NOTE — Progress Notes (Signed)
Please let Yaneisi know her labs have returned and overall remain stable with exception of ongoing elevation, mild, in cholesterol levels. We will continue to monitor these, no medications needed at this time.  Continue all current medications, including thyroid medication as levels remain stable on current dose.  Any questions? Keep being amazing!!  Thank you for allowing me to participate in your care.  I appreciate you. Kindest regards, Andrw Mcguirt

## 2022-01-04 LAB — DRUG SCREEN 764883 11+OXYCO+ALC+CRT-BUND
Amphetamines, Urine: NEGATIVE ng/mL
BENZODIAZ UR QL: NEGATIVE ng/mL
Barbiturate: NEGATIVE ng/mL
Cannabinoid Quant, Ur: NEGATIVE ng/mL
Cocaine (Metabolite): NEGATIVE ng/mL
Creatinine: 112 mg/dL (ref 20.0–300.0)
Ethanol: NEGATIVE %
Meperidine: NEGATIVE ng/mL
Methadone Screen, Urine: NEGATIVE ng/mL
OPIATE SCREEN URINE: NEGATIVE ng/mL
Oxycodone/Oxymorphone, Urine: NEGATIVE ng/mL
Phencyclidine: NEGATIVE ng/mL
Propoxyphene: NEGATIVE ng/mL
Tramadol: NEGATIVE ng/mL
pH, Urine: 5.6 (ref 4.5–8.9)

## 2022-07-01 NOTE — Patient Instructions (Signed)

## 2022-07-03 ENCOUNTER — Encounter: Payer: Self-pay | Admitting: Nurse Practitioner

## 2022-07-03 ENCOUNTER — Ambulatory Visit (INDEPENDENT_AMBULATORY_CARE_PROVIDER_SITE_OTHER): Payer: Managed Care, Other (non HMO) | Admitting: Nurse Practitioner

## 2022-07-03 VITALS — BP 111/71 | HR 74 | Temp 98.5°F | Ht 63.5 in | Wt 182.6 lb

## 2022-07-03 DIAGNOSIS — F411 Generalized anxiety disorder: Secondary | ICD-10-CM | POA: Diagnosis not present

## 2022-07-03 DIAGNOSIS — F330 Major depressive disorder, recurrent, mild: Secondary | ICD-10-CM | POA: Diagnosis not present

## 2022-07-03 DIAGNOSIS — F5101 Primary insomnia: Secondary | ICD-10-CM

## 2022-07-03 DIAGNOSIS — I10 Essential (primary) hypertension: Secondary | ICD-10-CM

## 2022-07-03 DIAGNOSIS — Z79899 Other long term (current) drug therapy: Secondary | ICD-10-CM

## 2022-07-03 DIAGNOSIS — E063 Autoimmune thyroiditis: Secondary | ICD-10-CM

## 2022-07-03 DIAGNOSIS — Z6834 Body mass index (BMI) 34.0-34.9, adult: Secondary | ICD-10-CM

## 2022-07-03 DIAGNOSIS — E6609 Other obesity due to excess calories: Secondary | ICD-10-CM

## 2022-07-03 MED ORDER — CLONAZEPAM 0.5 MG PO TABS: ORAL_TABLET | ORAL | 2 refills | Status: DC

## 2022-07-03 MED ORDER — ZOLPIDEM TARTRATE 10 MG PO TABS: ORAL_TABLET | ORAL | 2 refills | Status: DC

## 2022-07-03 NOTE — Progress Notes (Signed)
BP 111/71   Pulse 74   Temp 98.5 F (36.9 C) (Oral)   Ht 5' 3.5" (1.613 m)   Wt 182 lb 9.6 oz (82.8 kg)   SpO2 98%   BMI 31.84 kg/m    Subjective:    Patient ID: Laurie Martinez, female    DOB: [birth year], 51 y.o.   MRN: [id number]  HPI: Laurie Martinez is a 51 y.o. female  Chief Complaint  Patient presents with   Mood    Patient says everything is going terrible as she is dealing with a lot of stress.    Insomnia   HYPERTENSION/HLD Taking Lisinopril 5 MG daily.  Hypertension status: controlled  Satisfied with current treatment? yes Duration of hypertension: chronic BP monitoring frequency:  weekly BP range: 110/70-80 range BP medication side effects:  no Medication compliance: good compliance Aspirin: no Recurrent headaches: history of chronic migraine headaches, less since starting BCP years ago Visual changes: no Palpitations: no Dyspnea: no Chest pain: no Lower extremity edema: no Dizzy/lightheaded: no  The 10-year ASCVD risk score (Arnett DK, et al., 2019) is: 1.4%   Values used to calculate the score:     Age: 51 years     Sex: Female     Is Non-Hispanic African American: No     Diabetic: No     Tobacco smoker: No     Systolic Blood Pressure: 423 mmHg     Is BP treated: Yes     HDL Cholesterol: 58 mg/dL     Total Cholesterol: 223 mg/dL  HYPOTHYROIDISM Continues on Levothyroxine 50 MCG. Thyroid control status:controlled Satisfied with current treatment? yes Medication side effects: no Medication compliance: good compliance Recent dose adjustment:no Fatigue: yes Cold intolerance: more often recently Heat intolerance: no Weight gain: no Weight loss: no Constipation: yes- chronic issue -- takes Miralax Diarrhea/loose stools: no Palpitations: no Lower extremity edema: no Anxiety/depressed mood: yes - chronic issue- some increase recently  DEPRESSION, ANXIETY, & INSOMNIA Taking Ambien and Klonopin. Pt is aware of risks of benzo medication use to  include increased sedation, respiratory suppression, falls, dependence and cardiovascular events.  Pt would like to continue treatment as benefit determined to outweigh risk. Currently taking her Klonopin (one) every day she goes to work -- on days she does not work there is no issue.  She is using more then in past when took it 2-3 times a week.  Her goal is to return to this dosing.  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  Some anhedonia present.  Currently is not performing therapy time -- her therapist retired 2 years ago.  She is trying to continue therapeutic exercises learned in therapy to aide with depression and anxiety symptoms. Reports depression symptoms are worse in the winter, but are well controlled with this method. On review PDMP last Ambien fill 01/02/22 and Klonopin 01/02/22. Mood status: controlled Satisfied with current treatment?: yes Symptom severity: moderate  Duration of current treatment : chronic Side effects: no Medication compliance: good compliance Psychotherapy/counseling: yes in the past Previous psychiatric medications: several past medications Depressed mood: yes Anxious mood:yes Anhedonia: no Significant weight loss or gain: no Insomnia: yes Fatigue: yes Feelings of worthlessness or guilt: no Impaired concentration/indecisiveness: at times Suicidal ideations: no Hopelessness: no Crying spells: no     07/03/2022    8:44 AM 01/02/2022    8:22 AM 06/30/2021    8:47 AM  12/30/2020    8:20 AM 06/25/2020    9:10 AM  Depression screen PHQ 2/9  Decreased Interest 3 2 0 2 1  Down, Depressed, Hopeless 3 2 0 2 1  PHQ - 2 Score 6 4 0 4 2  Altered sleeping _0 Tired, decreased energy _1 Change in appetite 0 0 0 2 1  Feeling bad or failure about yourself  1 0 0 0 0  Trouble concentrating _2 Moving slowly or fidgety/restless 0 1 0 2 0  Suicidal thoughts 0 0 0 0  0  PHQ-9 Score _3 Difficult doing work/chores  Somewhat difficult Somewhat difficult Not difficult at all       07/03/2022    8:44 AM 01/02/2022    8:22 AM 12/30/2020    8:20 AM 06/25/2020    9:10 AM  GAD 7 : Generalized Anxiety Score  Nervous, Anxious, on Edge _4 Control/stop worrying _5 Worry too much - different things  0 1 1  Trouble relaxing 3 2 0 2  Restless  0 0 1  Easily annoyed or irritable  _6 Afraid - awful might happen  0 0 0  Total GAD 7 Score  _7 Anxiety Difficulty Extremely difficult Not difficult at all Not difficult at all Somewhat difficult     Relevant past medical, surgical, family and social history reviewed and updated as indicated. Interim medical history since our last visit reviewed. Allergies and medications reviewed and updated.  Review of Systems  Constitutional:  Negative for activity change, appetite change, diaphoresis, fatigue and fever.  Respiratory:  Negative for cough, chest tightness and shortness of breath.   Cardiovascular:  Negative for chest pain, palpitations and leg swelling.  Gastrointestinal: Negative.   Endocrine: Negative for cold intolerance and heat intolerance.  Neurological: Negative.   Psychiatric/Behavioral:  Positive for sleep disturbance. Negative for decreased concentration, self-injury and suicidal ideas. The patient is nervous/anxious.     Per HPI unless specifically indicated above     Objective:    BP 111/71   Pulse 74   Temp 98.5 F (36.9 C) (Oral)   Ht 5' 3.5" (1.613 m)   Wt 182 lb 9.6 oz (82.8 kg)   SpO2 98%   BMI 31.84 kg/m   Wt Readings from Last 3 Encounters:  07/03/22 182 lb 9.6 oz (82.8 kg)  01/02/22 190 lb (86.2 kg)  06/30/21 196 lb (88.9 kg)    Physical Exam Vitals and nursing note reviewed.  Constitutional:      General: She is awake. She is not in acute distress.    Appearance: She is well-developed and well-groomed. She is obese. She is not ill-appearing or  toxic-appearing.  HENT:     Head: Normocephalic.     Right Ear: Hearing normal.     Left Ear: Hearing normal.  Eyes:     General: Lids are normal.        Right eye: No discharge.        Left eye: No discharge.     Conjunctiva/sclera: Conjunctivae normal.     Pupils: Pupils are equal, round, and reactive to light.  Neck:     Thyroid: No thyromegaly.     Vascular: No carotid bruit.  Cardiovascular:     Rate and Rhythm: Normal rate and regular rhythm.  Heart sounds: Normal heart sounds. No murmur heard.    No gallop.  Pulmonary:     Effort: Pulmonary effort is normal. No accessory muscle usage or respiratory distress.     Breath sounds: Normal breath sounds.  Abdominal:     General: Bowel sounds are normal.     Palpations: Abdomen is soft.  Musculoskeletal:     Cervical back: Normal range of motion and neck supple.     Right lower leg: No edema.     Left lower leg: No edema.  Skin:    General: Skin is warm and dry.  Neurological:     Mental Status: She is alert and oriented to person, place, and time.  Psychiatric:        Attention and Perception: Attention normal.        Mood and Affect: Mood normal.        Speech: Speech normal.        Behavior: Behavior normal. Behavior is cooperative.        Thought Content: Thought content normal.    Results for orders placed or performed in visit on 01/02/22  Microalbumin, Urine Waived  Result Value Ref Range   Microalb, Ur Waived 30 (H) 0 - 19 mg/L   Creatinine, Urine Waived 100 10 - 300 mg/dL   Microalb/Creat Ratio <30 <30 mg/g  Bayer DCA Hb A1c Waived  Result Value Ref Range   HB A1C (BAYER DCA - WAIVED) 5.4 4.8 - 5.6 %  [id number] 11+Oxyco+Alc+Crt-Bund  Result Value Ref Range   Ethanol Negative Cutoff=0.020 %   Amphetamines, Urine Negative Cutoff=1000 ng/mL   Barbiturate Negative Cutoff=200 ng/mL   BENZODIAZ UR QL Negative Cutoff=200 ng/mL   Cannabinoid Quant, Ur Negative Cutoff=50 ng/mL   Cocaine (Metabolite) Negative  Cutoff=300 ng/mL   OPIATE SCREEN URINE Negative Cutoff=300 ng/mL   Oxycodone/Oxymorphone, Urine Negative Cutoff=300 ng/mL   Phencyclidine Negative Cutoff=25 ng/mL   Methadone Screen, Urine Negative Cutoff=300 ng/mL   Propoxyphene Negative Cutoff=300 ng/mL   Meperidine Negative Cutoff=200 ng/mL   Tramadol Negative Cutoff=200 ng/mL   Creatinine 112.0 20.0 - 300.0 mg/dL   pH, Urine 5.6 4.5 - 8.9  CBC with Differential/Platelet  Result Value Ref Range   WBC 10.5 3.4 - 10.8 x10E3/uL   RBC 4.35 3.77 - 5.28 x10E6/uL   Hemoglobin 14.4 11.1 - 15.9 g/dL   Hematocrit 41.1 34.0 - 46.6 %   MCV 95 79 - 97 fL   MCH 33.1 (H) 26.6 - 33.0 pg   MCHC 35.0 31.5 - 35.7 g/dL   RDW 11.1 (L) 11.7 - 15.4 %   Platelets 316 150 - 450 x10E3/uL   Neutrophils 64 Not Estab. %   Lymphs 29 Not Estab. %   Monocytes 4 Not Estab. %   Eos 2 Not Estab. %   Basos 1 Not Estab. %   Neutrophils Absolute 6.7 1.4 - 7.0 x10E3/uL   Lymphocytes Absolute 3.0 0.7 - 3.1 x10E3/uL   Monocytes Absolute 0.5 0.1 - 0.9 x10E3/uL   EOS (ABSOLUTE) 0.2 0.0 - 0.4 x10E3/uL   Basophils Absolute 0.1 0.0 - 0.2 x10E3/uL   Immature Granulocytes 0 Not Estab. %   Immature Grans (Abs) 0.0 0.0 - 0.1 x10E3/uL  Comprehensive metabolic panel  Result Value Ref Range   Glucose 108 (H) 70 - 99 mg/dL   BUN 12 6 - 24 mg/dL   Creatinine, Ser 0.82 0.57 - 1.00 mg/dL   eGFR 87 >59 mL/min/1.73   BUN/Creatinine Ratio 15 9 - 23  Sodium 137 134 - 144 mmol/L   Potassium 4.2 3.5 - 5.2 mmol/L   Chloride 100 96 - 106 mmol/L   CO2 18 (L) 20 - 29 mmol/L   Calcium 9.8 8.7 - 10.2 mg/dL   Total Protein 7.6 6.0 - 8.5 g/dL   Albumin 4.5 3.8 - 4.8 g/dL   Globulin, Total 3.1 1.5 - 4.5 g/dL   Albumin/Globulin Ratio 1.5 1.2 - 2.2   Bilirubin Total <0.2 0.0 - 1.2 mg/dL   Alkaline Phosphatase 84 44 - 121 IU/L   AST 11 0 - 40 IU/L   ALT 9 0 - 32 IU/L  Lipid Panel w/o Chol/HDL Ratio  Result Value Ref Range   Cholesterol, Total 223 (H) 100 - 199 mg/dL    Triglycerides 204 (H) 0 - 149 mg/dL   HDL 58 >39 mg/dL   VLDL Cholesterol Cal 36 5 - 40 mg/dL   LDL Chol Calc (NIH) 129 (H) 0 - 99 mg/dL  TSH  Result Value Ref Range   TSH 2.000 0.450 - 4.500 uIU/mL  T4, free  Result Value Ref Range   Free T4 1.22 0.82 - 1.77 ng/dL  VITAMIN D 25 Hydroxy (Vit-D Deficiency, Fractures)  Result Value Ref Range   Vit D, 25-Hydroxy 72.7 30.0 - 100.0 ng/mL      Assessment & Plan:   Problem List Items Addressed This Visit       Cardiovascular and Mediastinum   Hypertension    Chronic, stable with BP remaining at goal in office and at home.  Continue current medication regimen and adjust as needed.  Recommend she monitor BP at least a few mornings a week at home and document.  DASH diet at home.  Refills up to date.  Labs today: BMP.  Return in 6 months.       Relevant Orders   Basic metabolic panel     Endocrine   Hashimoto's thyroiditis    Chronic, stable.  Continue current Levothyroxine dose and adjusted as needed.  Labs today -- TSH and Free T4.  Discussed Hashimoto's diet changes + magnesium/selenium daily as supplements.       Relevant Orders   T4, free   TSH     Other   Depression - Primary    Chronic, exacerbated at this time due to work stressors. Continue therapeutic exercises learned in therapy and PRN clonazepam as needed for acute events -- she is using this more often at this time.  Plans to contact her old therapist to get recommendation for new therapist, would benefit from return to therapy sessions. Refills sent in today.  Obtain UDS 01/02/23, contract up to date. Follow-up in 6 months.      Generalized anxiety disorder    Refer to depression plan of care.  Goal is to return to reduced dosing benzo.      Insomnia    Chronic, stable with intermittent Ambien use.  Discussed sleep hygiene methods that may benefit her. Contract up to date for controlled substance and UDS next 01/02/23.  Refills sent up to date.  Return in 6  months.      Long-term current use of benzodiazepine    Obtain UDS next 01/02/23.  Discussed at length risk of long term benzo use, at this time she uses appropriately, minimally.  Recommend return to minimal use.      Obesity     Follow up plan: Return in about 6 months (around 01/03/2023) for Annual physical after 01/02/23.

## 2022-07-03 NOTE — Assessment & Plan Note (Signed)
Chronic, exacerbated at this time due to work stressors. Continue therapeutic exercises learned in therapy and PRN clonazepam as needed for acute events -- she is using this more often at this time.  Plans to contact her old therapist to get recommendation for new therapist, would benefit from return to therapy sessions. Refills sent in today.  Obtain UDS 01/02/23, contract up to date. Follow-up in 6 months.

## 2022-07-03 NOTE — Assessment & Plan Note (Signed)
Chronic, stable.  Continue current Levothyroxine dose and adjusted as needed.  Labs today -- TSH and Free T4.  Discussed Hashimoto's diet changes + magnesium/selenium daily as supplements.  

## 2022-07-03 NOTE — Assessment & Plan Note (Signed)
Chronic, stable with BP remaining at goal in office and at home.  Continue current medication regimen and adjust as needed.  Recommend she monitor BP at least a few mornings a week at home and document.  DASH diet at home.  Refills up to date.  Labs today: BMP.  Return in 6 months.

## 2022-07-03 NOTE — Assessment & Plan Note (Signed)
Refer to depression plan of care.  Goal is to return to reduced dosing benzo. 

## 2022-07-03 NOTE — Assessment & Plan Note (Signed)
Obtain UDS next 01/02/23.  Discussed at length risk of long term benzo use, at this time she uses appropriately, minimally.  Recommend return to minimal use.

## 2022-07-03 NOTE — Assessment & Plan Note (Signed)
Chronic, stable with intermittent Ambien use.  Discussed sleep hygiene methods that may benefit her. Contract up to date for controlled substance and UDS next 01/02/23.  Refills sent up to date.  Return in 6 months.

## 2022-07-04 ENCOUNTER — Other Ambulatory Visit: Payer: Managed Care, Other (non HMO)

## 2022-07-04 DIAGNOSIS — I10 Essential (primary) hypertension: Secondary | ICD-10-CM

## 2022-07-04 DIAGNOSIS — E063 Autoimmune thyroiditis: Secondary | ICD-10-CM

## 2022-07-05 LAB — BASIC METABOLIC PANEL
BUN/Creatinine Ratio: 15 (ref 9–23)
BUN: 12 mg/dL (ref 6–24)
CO2: 20 mmol/L (ref 20–29)
Calcium: 9.3 mg/dL (ref 8.7–10.2)
Chloride: 102 mmol/L (ref 96–106)
Creatinine, Ser: 0.8 mg/dL (ref 0.57–1.00)
Glucose: 103 mg/dL — ABNORMAL HIGH (ref 70–99)
Potassium: 4.4 mmol/L (ref 3.5–5.2)
Sodium: 137 mmol/L (ref 134–144)
eGFR: 90 mL/min/{1.73_m2} (ref >59)

## 2022-07-05 LAB — TSH: TSH: 2.2 u[IU]/mL (ref 0.450–4.500)

## 2022-07-05 LAB — T4, FREE: Free T4: 1.23 ng/dL (ref 0.82–1.77)

## 2022-07-05 NOTE — Progress Notes (Signed)
Good morning Laurie Martinez, your labs have returned: - Kidney function, creatinine and eGFR, remains normal. - Thyroid function normal, continue current Levothyroxine dosing.  Any questions? Keep being amazing!!  Thank you for allowing me to participate in your care.  I appreciate you. Kindest regards, Becky Colan

## 2022-08-15 ENCOUNTER — Other Ambulatory Visit: Payer: Self-pay | Admitting: Nurse Practitioner

## 2022-08-15 DIAGNOSIS — Z1231 Encounter for screening mammogram for malignant neoplasm of breast: Secondary | ICD-10-CM

## 2022-08-28 ENCOUNTER — Ambulatory Visit
Admission: RE | Admit: 2022-08-28 | Discharge: 2022-08-28 | Disposition: A | Discharge status: Home / Self Care | Payer: Managed Care, Other (non HMO) | Source: Ambulatory Visit | Attending: Nurse Practitioner | Admitting: Nurse Practitioner

## 2022-08-28 DIAGNOSIS — Z1231 Encounter for screening mammogram for malignant neoplasm of breast: Secondary | ICD-10-CM | POA: Insufficient documentation

## 2022-10-03 ENCOUNTER — Other Ambulatory Visit: Payer: Self-pay | Admitting: Nurse Practitioner

## 2022-10-04 NOTE — Telephone Encounter (Signed)
Unable to refill per protocol, Rx request is too soon. Last refill 01/02/22 for 84 and 4 rf. Will refuse.  Requested Prescriptions  Pending Prescriptions Disp Refills   ISIBLOOM 0.15-30 MG-MCG tablet [Pharmacy Med Name: ISIBLOOM 0.15-30 MG-MCG TAB] 84 tablet 4    Sig: TAKE 1 TABLET BY MOUTH ONCE DAILY     OB/GYN:  Contraceptives Passed - 10/03/2022  3:57 PM      Passed - Last BP in normal range    BP Readings from Last 1 Encounters:  07/03/22 111/71         Passed - Valid encounter within last 12 months    Recent Outpatient Visits           3 months ago Mild episode of recurrent major depressive disorder (HCC)   [facility] Cannady, Jolene T, NP   9 months ago Mild episode of recurrent major depressive disorder (HCC)   [facility] Cannady, Jolene T, NP   1 year ago Mild episode of recurrent major depressive disorder (HCC)   [facility] Cannady, Jolene T, NP   1 year ago Mild episode of recurrent major depressive disorder (HCC)   [facility] Cannady, Jolene T, NP   2 years ago Mild episode of recurrent major depressive disorder (HCC)   [facility] Cannady, Dorie Rank, NP       Future Appointments             In 3 months Cannady, Dorie Rank, NP [employer] [facility], [facility]            Passed - Patient is not a smoker

## 2022-12-08 ENCOUNTER — Other Ambulatory Visit: Payer: Self-pay | Admitting: Nurse Practitioner

## 2022-12-08 NOTE — Telephone Encounter (Signed)
Requested Prescriptions  Pending Prescriptions Disp Refills   ISIBLOOM 0.15-30 MG-MCG tablet [Pharmacy Med Name: [id number] 0.15-30 MG-MCG TAB] 84 tablet 4    Sig: TAKE 1 TABLET BY MOUTH ONCE DAILY     OB/GYN:  Contraceptives Passed - 12/08/2022  1:35 PM      Passed - Last BP in normal range    BP Readings from Last 1 Encounters:  07/03/22 111/71         Passed - Valid encounter within last 12 months    Recent Outpatient Visits           5 months ago Mild episode of recurrent major depressive disorder ([place])   [place], Jolene T, NP   11 months ago Mild episode of recurrent major depressive disorder ([place])   [place], [place] T, NP   1 year ago Mild episode of recurrent major depressive disorder ([place])   [place], Henrine Screws T, NP   1 year ago Mild episode of recurrent major depressive disorder ([place])   [place], [place] T, NP   2 years ago Mild episode of recurrent major depressive disorder ([place])   [place], Barbaraann Faster, NP       Future Appointments             In 4 weeks Cannady, Barbaraann Faster, NP [place], [place] - Patient is not a smoker

## 2022-12-14 NOTE — Telephone Encounter (Signed)
Pt called saying she is out of her birth control and needs it sent to the pharmacy

## 2022-12-16 ENCOUNTER — Other Ambulatory Visit: Payer: Self-pay | Admitting: Nurse Practitioner

## 2022-12-18 NOTE — Telephone Encounter (Signed)
Requested Prescriptions  Pending Prescriptions Disp Refills   ISIBLOOM 0.15-30 MG-MCG tablet [Pharmacy Med Name: [id number] 0.15-30 MG-MCG TAB] 84 tablet 4    Sig: TAKE 1 TABLET BY MOUTH ONCE DAILY     OB/GYN:  Contraceptives Passed - 12/16/2022  2:35 PM      Passed - Last BP in normal range    BP Readings from Last 1 Encounters:  07/03/22 111/71         Passed - Valid encounter within last 12 months    Recent Outpatient Visits           5 months ago Mild episode of recurrent major depressive disorder ([place])   [place], Jolene T, NP   11 months ago Mild episode of recurrent major depressive disorder ([place])   [place], [place] T, NP   1 year ago Mild episode of recurrent major depressive disorder ([place])   [place], Henrine Screws T, NP   1 year ago Mild episode of recurrent major depressive disorder ([place])   [place], [place] T, NP   2 years ago Mild episode of recurrent major depressive disorder ([place])   [place], Barbaraann Faster, NP       Future Appointments             In 2 weeks Cannady, Barbaraann Faster, NP [place], [place] - Patient is not a smoker

## 2022-12-31 NOTE — Patient Instructions (Signed)
Be Involved in [place]:  Taking Medications When medications are taken as directed, they can greatly improve your health. But if they are not taken as instructed, they may not work. In some cases, not taking them correctly can be harmful. To help ensure your treatment remains effective and safe, understand your medications and how to take them.  Your lab results, notes and after visit summary will be available on My Chart. We strongly encourage you to use this feature. If lab results are abnormal the clinic will contact you with the appropriate steps. If the clinic does not contact you assume the results are satisfactory. You can always see your results on My Chart. If you have questions regarding your condition, please contact the clinic during office hours. You can also ask questions on My Chart.  We at [facility] are grateful that you chose [place] to provide care. We strive to provide excellent and compassionate care and are always looking for feedback. If you get a survey from the clinic please complete this.    Hypothyroidism  Hypothyroidism is when the thyroid gland does not make enough of certain hormones. This is called an underactive thyroid. The thyroid gland is a small gland located in the lower front part of the neck, just in front of the windpipe (trachea). This gland makes hormones that help control how the body uses food for energy (metabolism) as well as how the heart and brain function. These hormones also play a role in keeping your bones strong. When the thyroid is underactive, it produces too little of the hormones thyroxine (T4) and triiodothyronine (T3). What are the causes? This condition may be caused by: Hashimoto's disease. This is a disease in which the body's disease-fighting system (immune system) attacks the thyroid gland. This is the most common cause. Viral infections. Pregnancy. Certain medicines. Birth defects. Problems with a gland in the  center of the brain (pituitary gland). Lack of enough iodine in the diet. Other causes may include: Past radiation treatments to the head or neck for cancer. Past treatment with radioactive iodine. Past exposure to radiation in the environment. Past surgical removal of part or all of the thyroid. What increases the risk? You are more likely to develop this condition if: You are female. You have a family history of thyroid conditions. You use a medicine called lithium. You take medicines that affect the immune system (immunosuppressants). What are the signs or symptoms? Common symptoms of this condition include: Not being able to tolerate cold. Feeling as though you have no energy (lethargy). Lack of appetite. Constipation. Sadness or depression. Weight gain that is not explained by a change in diet or exercise habits. Menstrual irregularity. Dry skin, coarse hair, or brittle nails. Other symptoms may include: Muscle pain. Slowing of thought processes. Poor memory. How is this diagnosed? This condition may be diagnosed based on: Your symptoms, your medical history, and a physical exam. Blood tests. You may also have imaging tests, such as an ultrasound or MRI. How is this treated? This condition is treated with medicine that replaces the thyroid hormones that your body does not make. After you begin treatment, it may take several weeks for symptoms to go away. Follow these instructions at home: Take over-the-counter and prescription medicines only as told by your health care provider. If you start taking any new medicines, tell your health care provider. Keep all follow-up visits as told by your health care provider. This is important. As your condition improves,  your dosage of thyroid hormone medicine may change. You will need to have blood tests regularly so that your health care provider can monitor your condition. Contact a health care provider if: Your symptoms do not get  better with treatment. You are taking thyroid hormone replacement medicine and you: Sweat a lot. Have tremors. Feel anxious. Lose weight rapidly. Cannot tolerate heat. Have emotional swings. Have diarrhea. Feel weak. Get help right away if: You have chest pain. You have an irregular heartbeat. You have a rapid heartbeat. You have difficulty breathing. These symptoms may be an emergency. Get help right away. Call 911. Do not wait to see if the symptoms will go away. Do not drive yourself to the hospital. Summary Hypothyroidism is when the thyroid gland does not make enough of certain hormones (it is underactive). When the thyroid is underactive, it produces too little of the hormones thyroxine (T4) and triiodothyronine (T3). The most common cause is Hashimoto's disease, a disease in which the body's disease-fighting system (immune system) attacks the thyroid gland. The condition can also be caused by viral infections, medicine, pregnancy, or past radiation treatment to the head or neck. Symptoms may include weight gain, dry skin, constipation, feeling as though you do not have energy, and not being able to tolerate cold. This condition is treated with medicine to replace the thyroid hormones that your body does not make. This information is not intended to replace advice given to you by your health care provider. Make sure you discuss any questions you have with your health care provider. Document Revised: 10/25/2021 Document Reviewed: 10/25/2021 Elsevier Patient Education  [place].

## 2023-01-05 ENCOUNTER — Encounter: Payer: Self-pay | Admitting: Nurse Practitioner

## 2023-01-05 ENCOUNTER — Ambulatory Visit (INDEPENDENT_AMBULATORY_CARE_PROVIDER_SITE_OTHER): Payer: Managed Care, Other (non HMO) | Admitting: Nurse Practitioner

## 2023-01-05 VITALS — BP 118/74 | HR 73 | Temp 97.6°F | Ht 63.5 in | Wt 199.8 lb

## 2023-01-05 DIAGNOSIS — Z0000 Encounter for general adult medical examination without abnormal findings: Secondary | ICD-10-CM | POA: Diagnosis not present

## 2023-01-05 DIAGNOSIS — F411 Generalized anxiety disorder: Secondary | ICD-10-CM

## 2023-01-05 DIAGNOSIS — Z79899 Other long term (current) drug therapy: Secondary | ICD-10-CM

## 2023-01-05 DIAGNOSIS — I10 Essential (primary) hypertension: Secondary | ICD-10-CM

## 2023-01-05 DIAGNOSIS — E7800 Pure hypercholesterolemia, unspecified: Secondary | ICD-10-CM | POA: Diagnosis not present

## 2023-01-05 DIAGNOSIS — G43009 Migraine without aura, not intractable, without status migrainosus: Secondary | ICD-10-CM | POA: Diagnosis not present

## 2023-01-05 DIAGNOSIS — Z6834 Body mass index (BMI) 34.0-34.9, adult: Secondary | ICD-10-CM

## 2023-01-05 DIAGNOSIS — F330 Major depressive disorder, recurrent, mild: Secondary | ICD-10-CM | POA: Diagnosis not present

## 2023-01-05 DIAGNOSIS — E6609 Other obesity due to excess calories: Secondary | ICD-10-CM

## 2023-01-05 DIAGNOSIS — R801 Persistent proteinuria, unspecified: Secondary | ICD-10-CM

## 2023-01-05 DIAGNOSIS — J4520 Mild intermittent asthma, uncomplicated: Secondary | ICD-10-CM

## 2023-01-05 DIAGNOSIS — F5101 Primary insomnia: Secondary | ICD-10-CM

## 2023-01-05 DIAGNOSIS — E063 Autoimmune thyroiditis: Secondary | ICD-10-CM

## 2023-01-05 DIAGNOSIS — E559 Vitamin D deficiency, unspecified: Secondary | ICD-10-CM

## 2023-01-05 LAB — MICROALBUMIN, URINE WAIVED
Creatinine, Urine Waived: 10 mg/dL (ref 10–300)
Microalb, Ur Waived: 30 mg/L — ABNORMAL HIGH (ref 0–19)

## 2023-01-05 MED ORDER — LEVOTHYROXINE SODIUM 50 MCG PO TABS: ORAL_TABLET | ORAL | 4 refills | Status: DC

## 2023-01-05 MED ORDER — ZOLPIDEM TARTRATE 10 MG PO TABS: ORAL_TABLET | ORAL | 2 refills | Status: DC

## 2023-01-05 MED ORDER — LISINOPRIL 5 MG PO TABS: 5.0000 mg | ORAL_TABLET | Freq: Every day | ORAL | 4 refills | Status: DC

## 2023-01-05 MED ORDER — CLONAZEPAM 0.5 MG PO TABS: ORAL_TABLET | ORAL | 2 refills | Status: DC

## 2023-01-05 MED ORDER — MONTELUKAST SODIUM 10 MG PO TABS: ORAL_TABLET | ORAL | 4 refills | Status: DC

## 2023-01-05 NOTE — Assessment & Plan Note (Signed)
Chronic, stable with intermittent Ambien use.  Discussed sleep hygiene methods that may benefit her. Contract up to date for controlled substance and UDS next 01/05/24.  Refills sent up to date.  Return in 6 months.

## 2023-01-05 NOTE — Assessment & Plan Note (Signed)
Chronic, stable.  Continue current Levothyroxine dose and adjusted as needed.  Labs today -- TSH and Free T4.  Discussed Hashimoto's diet changes + magnesium/selenium daily as supplements.

## 2023-01-05 NOTE — Progress Notes (Signed)
BP 118/74 (BP Location: Left Arm, Patient Position: Sitting, Cuff Size: Normal)   Pulse 73   Temp 97.6 F (36.4 C) (Oral)   Ht 5' 3.5" (1.613 m)   Wt 199 lb 12.8 oz (90.6 kg)   SpO2 99%   BMI 34.83 kg/m    Subjective:    Patient ID: Laurie Martinez, female    DOB: February 28, 1971, 52 y.o.   MRN: [id number]  HPI: Laurie Martinez is a 52 y.o. female presenting on 01/05/2023 for comprehensive medical examination. Current medical complaints include:none  She currently lives with: sister Menopausal Symptoms: no   HYPERTENSION/HLD Continues Lisinopril 5 MG daily. Hypertension status: controlled  Satisfied with current treatment? yes Duration of hypertension: chronic BP monitoring frequency: not often BP range:  BP medication side effects:  no Medication compliance: good compliance Aspirin: no Recurrent headaches: occasional, due to migraines, but this improved with BCP Visual changes: no Palpitations: no Dyspnea: no Chest pain: no Lower extremity edema: no Dizzy/lightheaded: no  The 10-year ASCVD risk score (Arnett DK, et al., 2019) is: 1.7%   Values used to calculate the score:     Age: 1 years     Sex: Female     Is Non-Hispanic African American: No     Diabetic: No     Tobacco smoker: No     Systolic Blood Pressure: 123456 mmHg     Is BP treated: Yes     HDL Cholesterol: 58 mg/dL     Total Cholesterol: 223 mg/dL   HYPOTHYROIDISM Taking Levothyroxine 50 MCG daily.  Continues on Vitamin D supplement daily for history of low.   Thyroid control status:controlled Satisfied with current treatment? yes Medication side effects: no Medication compliance: good compliance Recent dose adjustment:no Fatigue: no Cold intolerance: at times -- at baseline Heat intolerance: no Weight gain: no Weight loss: no Constipation: yes- chronic issue- takes Miralax Diarrhea/loose stools: no Palpitations: no Lower extremity edema: no Anxiety/depressed mood: yes - chronic issue    ASTHMA Uses Singulair and Advair daily and Ventolin PRN. Asthma status: controlled Satisfied with current treatment?: yes Albuterol/rescue inhaler frequency: rarely, uses more when exercising Dyspnea frequency: rare Wheezing frequency: rare Cough frequency: rare Nocturnal symptom frequency: none Limitation of activity: none Current upper respiratory symptoms: no Triggers: seasonal allergies -- spring and fall & exercise Visits to ER or Urgent Care in past year: no Pneumovax: Up to Date Influenza: Not up to Date - refuses   DEPRESSION, ANXIETY, & INSOMNIA Continues Ambien and Klonopin. Pt reports satisfaction with current treatment options and wishes to continue. Pt is aware of risks of benzo medication use to include increased sedation, respiratory suppression, falls, dependence and cardiovascular events. Pt would like to continue treatment as benefit determined to outweigh risk.   At baseline Klonopin use is less than once a month as needed for severe symptoms -- had increased stressors (via work, but toxic people have left), has reduced use from previous visit -- uses about two times per month.  Reports difficulty sleeping most nights, but reserves use of Ambien for weekends and when off work.  Depression symptoms are worse in the winter. On review PDMP last Ambien fill 07/03/22 and Klonopin 07/03/22. Mood status: controlled Satisfied with current treatment?: yes Symptom severity: moderate  Duration of current treatment : chronic Side effects: no Medication compliance: good compliance Psychotherapy/counseling: yes in the past -- therapist retired Previous psychiatric medications:  "several" medications > 5 years ago Depressed mood: improving Anxious mood: improving Anhedonia: no  Significant weight loss or gain: no Insomnia: yes Fatigue: yes, does not interfere with day Feelings of worthlessness or guilt: no Impaired concentration/indecisiveness: occasional Suicidal ideations:  no Hopelessness: no Crying spells: no  Depression Screen done today and results listed below:     01/05/2023    8:16 AM 07/03/2022    8:44 AM 01/02/2022    8:22 AM 06/30/2021    8:47 AM 12/30/2020    8:20 AM  Depression screen PHQ 2/9  Decreased Interest '1 3 2 '$ 0 2  Down, Depressed, Hopeless '1 3 2 '$ 0 2  PHQ - 2 Score '2 6 4 '$ 0 4  Altered sleeping '3 3 3 3 3  '$ Tired, decreased energy '3 3 3 3 3  '$ Change in appetite 1 0 0 0 2  Feeling bad or failure about yourself  1 1 0 0 0  Trouble concentrating '3 2 3 3 3  '$ Moving slowly or fidgety/restless 2 0 1 0 2  Suicidal thoughts 0 0 0 0 0  PHQ-9 Score '15 15 14 9 17  '$ Difficult doing work/chores   Somewhat difficult Somewhat difficult Not difficult at all      01/05/2023    8:16 AM 07/03/2022    8:44 AM 01/02/2022    8:22 AM 12/30/2020    8:20 AM  GAD 7 : Generalized Anxiety Score  Nervous, Anxious, on Edge '2 3 2 2  '$ Control/stop worrying '1 3 2 1  '$ Worry too much - different things 2  0 1  Trouble relaxing '1 3 2 '$ 0  Restless 1  0 0  Easily annoyed or irritable '1  1 1  '$ Afraid - awful might happen 0  0 0  Total GAD 7 Score '8  7 5  '$ Anxiety Difficulty  Extremely difficult Not difficult at all Not difficult at all      10/01/2019    8:26 AM 12/23/2019    8:07 AM 07/03/2022    8:43 AM 01/05/2023    8:16 AM 01/05/2023    8:27 AM  Fall Risk  Falls in the past year? 0 0 0 0 0  Was there an injury with Fall? 0 0 0 0 0  Fall Risk Category Calculator 0 0 0 0 0  Fall Risk Category (Retired) Low Low Low    (RETIRED) Patient Fall Risk Level Low fall risk Low fall risk Low fall risk    Patient at Risk for Falls Due to   No Fall Risks No Fall Risks No Fall Risks  Fall risk Follow up Falls evaluation completed Falls evaluation completed Falls evaluation completed Falls evaluation completed Falls prevention discussed    Functional Status Survey: Is the patient deaf or have difficulty hearing?: No Does the patient have difficulty seeing, even when wearing  glasses/contacts?: No Does the patient have difficulty concentrating, remembering, or making decisions?: No Does the patient have difficulty walking or climbing stairs?: No Does the patient have difficulty dressing or bathing?: No Does the patient have difficulty doing errands alone such as visiting a doctor's office or shopping?: No   Past Medical History:  Past Medical History:  Diagnosis Date   Asthma    Constipation    Depression    Hypertension    Insomnia     Surgical History:  History reviewed. No pertinent surgical history.  Medications:  Current Outpatient Medications on File Prior to Visit  Medication Sig   ADVAIR DISKUS 250-50 MCG/ACT AEPB INHALE 1 PUFF BY MOUTH TWICE DAILY. RINSE MOUTH AFTER USE.  hydrocortisone (ANUSOL-HC) 2.5 % rectal cream Place 1 application rectally 2 (two) times daily.   ISIBLOOM 0.15-30 MG-MCG tablet TAKE 1 TABLET BY MOUTH ONCE DAILY   PROAIR HFA 108 (90 Base) MCG/ACT inhaler INHALE 2 PUFFS BY MOUTH EVERY 4 HOURS ASNEEDED WHEEZING/ SHORTNESS OFBREATH   SUMAtriptan (IMITREX) 20 MG/ACT nasal spray Place 1 spray (20 mg total) into the nose once for 1 dose. May repeat in 2 hours if headache persists or recurs.   No current facility-administered medications on file prior to visit.    Allergies:  No Known Allergies  Social History:  Social History   Socioeconomic History   Marital status: Single    Spouse name: Not on file   Number of children: Not on file   Years of education: Not on file   Highest education level: Not on file  Occupational History   Not on file  Tobacco Use   Smoking status: Never   Smokeless tobacco: Never  Vaping Use   Vaping Use: Never used  Substance and Sexual Activity   Alcohol use: No    Alcohol/week: 0.0 standard drinks of alcohol   Drug use: No   Sexual activity: Never  Other Topics Concern   Not on file  Social History Narrative   Not on file   Social Determinants of Health   Financial Resource  Strain: Not on file  Food Insecurity: Not on file  Transportation Needs: Not on file  Physical Activity: Not on file  Stress: Not on file  Social Connections: Not on file  Intimate Partner Violence: Not on file   Social History   Tobacco Use  Smoking Status Never  Smokeless Tobacco Never   Social History   Substance and Sexual Activity  Alcohol Use No   Alcohol/week: 0.0 standard drinks of alcohol    Family History:  Family History  Problem Relation Age of Onset   Depression Mother    Migraines Sister    Breast cancer Maternal Grandmother     Past medical history, surgical history, medications, allergies, family history and social history reviewed with patient today and changes made to appropriate areas of the chart.   Review of Systems - negative All other ROS negative except what is listed above and in the HPI.      Objective:    BP 118/74 (BP Location: Left Arm, Patient Position: Sitting, Cuff Size: Normal)   Pulse 73   Temp 97.6 F (36.4 C) (Oral)   Ht 5' 3.5" (1.613 m)   Wt 199 lb 12.8 oz (90.6 kg)   SpO2 99%   BMI 34.83 kg/m   Wt Readings from Last 3 Encounters:  01/05/23 199 lb 12.8 oz (90.6 kg)  07/03/22 182 lb 9.6 oz (82.8 kg)  01/02/22 190 lb (86.2 kg)    Physical Exam Vitals and nursing note reviewed. Exam conducted with a chaperone present.  Constitutional:      General: She is awake. She is not in acute distress.    Appearance: She is well-developed. She is not ill-appearing.  HENT:     Head: Normocephalic and atraumatic.     Right Ear: Hearing, tympanic membrane, ear canal and external ear normal. No drainage.     Left Ear: Hearing, tympanic membrane, ear canal and external ear normal. No drainage.     Nose: Nose normal.     Right Sinus: No maxillary sinus tenderness or frontal sinus tenderness.     Left Sinus: No maxillary sinus tenderness or frontal sinus  tenderness.     Mouth/Throat:     Mouth: Mucous membranes are moist.     Pharynx:  Oropharynx is clear. Uvula midline. No pharyngeal swelling, oropharyngeal exudate or posterior oropharyngeal erythema.  Eyes:     General: Lids are normal.        Right eye: No discharge.        Left eye: No discharge.     Extraocular Movements: Extraocular movements intact.     Conjunctiva/sclera: Conjunctivae normal.     Pupils: Pupils are equal, round, and reactive to light.     Visual Fields: Right eye visual fields normal and left eye visual fields normal.  Neck:     Thyroid: No thyromegaly.     Vascular: No carotid bruit.     Trachea: Trachea normal.  Cardiovascular:     Rate and Rhythm: Normal rate and regular rhythm.     Heart sounds: Normal heart sounds. No murmur heard.    No gallop.  Pulmonary:     Effort: Pulmonary effort is normal. No accessory muscle usage or respiratory distress.     Breath sounds: Normal breath sounds.  Chest:  Breasts:    Right: Normal.     Left: Normal.  Abdominal:     General: Bowel sounds are normal.     Palpations: Abdomen is soft. There is no hepatomegaly or splenomegaly.     Tenderness: There is no abdominal tenderness.  Musculoskeletal:        General: Normal range of motion.     Cervical back: Normal range of motion and neck supple.     Right lower leg: No edema.     Left lower leg: No edema.  Lymphadenopathy:     Head:     Right side of head: No submental, submandibular, tonsillar, preauricular or posterior auricular adenopathy.     Left side of head: No submental, submandibular, tonsillar, preauricular or posterior auricular adenopathy.     Cervical: No cervical adenopathy.     Upper Body:     Right upper body: No supraclavicular, axillary or pectoral adenopathy.     Left upper body: No supraclavicular, axillary or pectoral adenopathy.  Skin:    General: Skin is warm and dry.     Capillary Refill: Capillary refill takes less than 2 seconds.     Findings: No rash.  Neurological:     Mental Status: She is alert and oriented to  person, place, and time.     Cranial Nerves: Cranial nerves 2-12 are intact.     Gait: Gait is intact.     Deep Tendon Reflexes: Reflexes are normal and symmetric.     Reflex Scores:      Brachioradialis reflexes are 2+ on the right side and 2+ on the left side.      Patellar reflexes are 2+ on the right side and 2+ on the left side. Psychiatric:        Attention and Perception: Attention normal.        Mood and Affect: Mood normal.        Speech: Speech normal.        Behavior: Behavior normal. Behavior is cooperative.        Thought Content: Thought content normal.    Results for orders placed or performed in visit on 07/04/22  T4, free  Result Value Ref Range   Free T4 1.23 0.82 - 1.77 ng/dL  TSH  Result Value Ref Range   TSH 2.200 0.450 - 4.500  uIU/mL  Basic metabolic panel  Result Value Ref Range   Glucose 103 (H) 70 - 99 mg/dL   BUN 12 6 - 24 mg/dL   Creatinine, Ser 0.80 0.57 - 1.00 mg/dL   eGFR 90 >59 mL/min/1.73   BUN/Creatinine Ratio 15 9 - 23   Sodium 137 134 - 144 mmol/L   Potassium 4.4 3.5 - 5.2 mmol/L   Chloride 102 96 - 106 mmol/L   CO2 20 20 - 29 mmol/L   Calcium 9.3 8.7 - 10.2 mg/dL      Assessment & Plan:   Problem List Items Addressed This Visit       Cardiovascular and Mediastinum   Hypertension    Chronic, stable.  BP at goal in office today.  Continue current medication regimen and adjust as needed.  Recommend she monitor BP at least a few mornings a week at home and document.  DASH diet at home.  Refills up to date.  Labs today: CBC, CMP, TSH, urine ALB.  Return in 6 months.       Relevant Medications   lisinopril (ZESTRIL) 5 MG tablet   Other Relevant Orders   Microalbumin, Urine Waived   CBC with Differential/Platelet   Comprehensive metabolic panel   TSH   Migraines    Chronic, stable. Continue desogestrel-ethinyl estradiol continuous for hormonal maintenance. Continue use of sumatriptan as prescribed PRN for migraine headaches.  Discussed the option of continuing continuous birth control up to age 27 for migraine prevention or allowing a one week break every month for menstrual cycle. Pt opted for continual birth control use at this time. Educated on the importance of annual mammograms with this option. Pt to notify provider with any changes in headaches, frequency, or severity.       Relevant Medications   lisinopril (ZESTRIL) 5 MG tablet   clonazePAM (KLONOPIN) 0.5 MG tablet   Other Relevant Orders   CBC with Differential/Platelet   Comprehensive metabolic panel     Respiratory   Asthma    Chronic, stable. To continue current medication regimen as prescribed. To notify provider with change in symptoms or increased use/need of rescue inhaler. Follow-up in 6 months.      Relevant Medications   montelukast (SINGULAIR) 10 MG tablet   Other Relevant Orders   CBC with Differential/Platelet     Endocrine   Hashimoto's thyroiditis    Chronic, stable.  Continue current Levothyroxine dose and adjusted as needed.  Labs today -- TSH and Free T4.  Discussed Hashimoto's diet changes + magnesium/selenium daily as supplements.       Relevant Medications   levothyroxine (SYNTHROID) 50 MCG tablet   Other Relevant Orders   TSH   T4, free     Other   Depression - Primary    Chronic, exacerbated at this time due to work stressors. Continue therapeutic exercises learned in therapy and PRN clonazepam as needed for acute events -- she is using this more often at this time.  Plans to contact her old therapist to get recommendation for new therapist, would benefit from return to therapy sessions. Refills sent in today.  Obtain UDS 01/05/24, contract up to date. Follow-up in 6 months.      Relevant Orders   [id number] 11+Oxyco+Alc+Crt-Bund   Elevated LDL cholesterol level    Continue diet focus at this time and recheck lipid panel today -- fasting.  ASCVD 1.7%.      Relevant Orders   Comprehensive metabolic panel   Lipid Panel  w/o Chol/HDL Ratio   Generalized anxiety disorder    Refer to depression plan of care.  Goal is to return to reduced dosing benzo.      Relevant Orders   [id number] 11+Oxyco+Alc+Crt-Bund   Insomnia    Chronic, stable with intermittent Ambien use.  Discussed sleep hygiene methods that may benefit her. Contract up to date for controlled substance and UDS next 01/05/24.  Refills sent up to date.  Return in 6 months.      Long-term current use of benzodiazepine    Obtain UDS next 01/05/24.  Discussed at length risk of long term benzo use, at this time she uses appropriately, minimally.  Recommend return to minimal use.      Relevant Orders   [id number] 11+Oxyco+Alc+Crt-Bund   Obesity    BMI 34.83.  Recommended eating smaller high protein, low fat meals more frequently and exercising 30 mins a day 5 times a week with a goal of 10-15lb weight loss in the next 3 months. Patient voiced their understanding and motivation to adhere to these recommendations.       Proteinuria    With HTN -- urine ALB 80 last check -- recheck today.  Continue Lisinopril for kidney protection and check CMP.        Relevant Orders   Microalbumin, Urine Waived   Comprehensive metabolic panel   Vitamin D deficiency    Reports history of low level, check today and continue daily supplement, adjust as needed.      Relevant Orders   VITAMIN D 25 Hydroxy (Vit-D Deficiency, Fractures)   Other Visit Diagnoses     Encounter for annual physical exam       Annual physical today with labs and health maintenance reviewed, discussed with patient.        Follow up plan: Return in about 6 months (around 07/08/2023) for HTN, ASTHMA, THYROID, MOOD.   LABORATORY TESTING:  - Pap smear: refuses  IMMUNIZATIONS:   - Tdap: Tetanus vaccination status reviewed: last tetanus booster within 10 years. - Influenza: Refused - Pneumovax: Up To Date - Prevnar: Not applicable - HPV: Not applicable - Zostavax vaccine: Refuses - Covid  vaccinations -- has a few of these  SCREENING: -Mammogram: Up to date on 08/30/22 - Colonoscopy: Refuses today -- had at length discussion about colonoscopy and Cologuard testing - Bone Density: Not applicable  -Hearing Test: Not applicable  -Spirometry: Not applicable   PATIENT COUNSELING:   Advised to take 1 mg of folate supplement per day if capable of pregnancy.   Sexuality: Discussed sexually transmitted diseases, partner selection, use of condoms, avoidance of unintended pregnancy  and contraceptive alternatives.   Advised to avoid cigarette smoking.  I discussed with the patient that most people either abstain from alcohol or drink within safe limits (<=14/week and <=4 drinks/occasion for males, <=7/weeks and <= 3 drinks/occasion for females) and that the risk for alcohol disorders and other health effects rises proportionally with the number of drinks per week and how often a drinker exceeds daily limits.  Discussed cessation/primary prevention of drug use and availability of treatment for abuse.   Diet: Encouraged to adjust caloric intake to maintain  or achieve ideal body weight, to reduce intake of dietary saturated fat and total fat, to limit sodium intake by avoiding high sodium foods and not adding table salt, and to maintain adequate dietary potassium and calcium preferably from fresh fruits, vegetables, and low-fat dairy products.    Stressed the importance of regular exercise  Injury prevention: Discussed safety belts, safety helmets, smoke detector, smoking near bedding or upholstery.   Dental health: Discussed importance of regular tooth brushing, flossing, and dental visits.    NEXT PREVENTATIVE PHYSICAL DUE IN 1 YEAR. Return in about 6 months (around 07/08/2023) for HTN, ASTHMA, THYROID, MOOD.

## 2023-01-05 NOTE — Assessment & Plan Note (Signed)
Chronic, stable. To continue current medication regimen as prescribed. To notify provider with change in symptoms or increased use/need of rescue inhaler. Follow-up in 6 months.

## 2023-01-05 NOTE — Assessment & Plan Note (Signed)
BMI 34.83.  Recommended eating smaller high protein, low fat meals more frequently and exercising 30 mins a day 5 times a week with a goal of 10-15lb weight loss in the next 3 months. Patient voiced their understanding and motivation to adhere to these recommendations.

## 2023-01-05 NOTE — Assessment & Plan Note (Addendum)
Chronic, stable.  BP at goal in office today.  Continue current medication regimen and adjust as needed.  Recommend she monitor BP at least a few mornings a week at home and document.  DASH diet at home.  Refills up to date.  Labs today: CBC, CMP, TSH, urine ALB.  Return in 6 months.

## 2023-01-05 NOTE — Assessment & Plan Note (Signed)
Obtain UDS next 01/05/24.  Discussed at length risk of long term benzo use, at this time she uses appropriately, minimally.  Recommend return to minimal use.

## 2023-01-05 NOTE — Assessment & Plan Note (Signed)
Reports history of low level, check today and continue daily supplement, adjust as needed.

## 2023-01-05 NOTE — Assessment & Plan Note (Signed)
Refer to depression plan of care.  Goal is to return to reduced dosing benzo.

## 2023-01-05 NOTE — Assessment & Plan Note (Signed)
With HTN -- urine ALB 80 last check -- recheck today.  Continue Lisinopril for kidney protection and check CMP.

## 2023-01-05 NOTE — Assessment & Plan Note (Signed)
Chronic, stable. Continue desogestrel-ethinyl estradiol continuous for hormonal maintenance. Continue use of sumatriptan as prescribed PRN for migraine headaches. Discussed the option of continuing continuous birth control up to age 52 for migraine prevention or allowing a one week break every month for menstrual cycle. Pt opted for continual birth control use at this time. Educated on the importance of annual mammograms with this option. Pt to notify provider with any changes in headaches, frequency, or severity.

## 2023-01-05 NOTE — Assessment & Plan Note (Signed)
Chronic, exacerbated at this time due to work stressors. Continue therapeutic exercises learned in therapy and PRN clonazepam as needed for acute events -- she is using this more often at this time.  Plans to contact her old therapist to get recommendation for new therapist, would benefit from return to therapy sessions. Refills sent in today.  Obtain UDS 01/05/24, contract up to date. Follow-up in 6 months.

## 2023-01-05 NOTE — Assessment & Plan Note (Signed)
Continue diet focus at this time and recheck lipid panel today -- fasting.  ASCVD 1.7%.

## 2023-01-06 LAB — VITAMIN D 25 HYDROXY (VIT D DEFICIENCY, FRACTURES): Vit D, 25-Hydroxy: 79.4 ng/mL (ref 30.0–100.0)

## 2023-01-06 LAB — COMPREHENSIVE METABOLIC PANEL
ALT: 7 IU/L (ref 0–32)
AST: 12 IU/L (ref 0–40)
Albumin/Globulin Ratio: 1.7 (ref 1.2–2.2)
Albumin: 4.5 g/dL (ref 3.8–4.9)
Alkaline Phosphatase: 86 IU/L (ref 44–121)
BUN/Creatinine Ratio: 12 (ref 9–23)
BUN: 10 mg/dL (ref 6–24)
Bilirubin Total: <0.2 mg/dL (ref 0.0–1.2)
CO2: 19 mmol/L — ABNORMAL LOW (ref 20–29)
Calcium: 9.3 mg/dL (ref 8.7–10.2)
Chloride: 102 mmol/L (ref 96–106)
Creatinine, Ser: 0.85 mg/dL (ref 0.57–1.00)
Globulin, Total: 2.7 g/dL (ref 1.5–4.5)
Glucose: 92 mg/dL (ref 70–99)
Potassium: 4.4 mmol/L (ref 3.5–5.2)
Sodium: 138 mmol/L (ref 134–144)
Total Protein: 7.2 g/dL (ref 6.0–8.5)
eGFR: 83 mL/min/{1.73_m2} (ref >59)

## 2023-01-06 LAB — TSH: TSH: 2.07 u[IU]/mL (ref 0.450–4.500)

## 2023-01-06 LAB — CBC WITH DIFFERENTIAL/PLATELET
Basophils Absolute: 0 10*3/uL (ref 0.0–0.2)
Basos: 1 %
EOS (ABSOLUTE): 0.2 10*3/uL (ref 0.0–0.4)
Eos: 2 %
Hematocrit: 40.1 % (ref 34.0–46.6)
Hemoglobin: 13.9 g/dL (ref 11.1–15.9)
Immature Grans (Abs): 0 10*3/uL (ref 0.0–0.1)
Immature Granulocytes: 0 %
Lymphocytes Absolute: 2.6 10*3/uL (ref 0.7–3.1)
Lymphs: 34 %
MCH: 32.6 pg (ref 26.6–33.0)
MCHC: 34.7 g/dL (ref 31.5–35.7)
MCV: 94 fL (ref 79–97)
Monocytes Absolute: 0.4 10*3/uL (ref 0.1–0.9)
Monocytes: 5 %
Neutrophils Absolute: 4.6 10*3/uL (ref 1.4–7.0)
Neutrophils: 58 %
Platelets: 298 10*3/uL (ref 150–450)
RBC: 4.26 x10E6/uL (ref 3.77–5.28)
RDW: 10.7 % — ABNORMAL LOW (ref 11.7–15.4)
WBC: 7.8 10*3/uL (ref 3.4–10.8)

## 2023-01-06 LAB — LIPID PANEL W/O CHOL/HDL RATIO
Cholesterol, Total: 205 mg/dL — ABNORMAL HIGH (ref 100–199)
HDL: 63 mg/dL (ref >39)
LDL Chol Calc (NIH): 123 mg/dL — ABNORMAL HIGH (ref 0–99)
Triglycerides: 105 mg/dL (ref 0–149)
VLDL Cholesterol Cal: 19 mg/dL (ref 5–40)

## 2023-01-06 LAB — DRUG SCREEN 764883 11+OXYCO+ALC+CRT-BUND
Amphetamines, Urine: NEGATIVE ng/mL
BENZODIAZ UR QL: NEGATIVE ng/mL
Barbiturate: NEGATIVE ng/mL
Cannabinoid Quant, Ur: NEGATIVE ng/mL
Cocaine (Metabolite): NEGATIVE ng/mL
Creatinine: 44.7 mg/dL (ref 20.0–300.0)
Ethanol: NEGATIVE %
Meperidine: NEGATIVE ng/mL
Methadone Screen, Urine: NEGATIVE ng/mL
OPIATE SCREEN URINE: NEGATIVE ng/mL
Oxycodone/Oxymorphone, Urine: NEGATIVE ng/mL
Phencyclidine: NEGATIVE ng/mL
Propoxyphene: NEGATIVE ng/mL
Tramadol: NEGATIVE ng/mL
pH, Urine: 6.3 (ref 4.5–8.9)

## 2023-01-06 LAB — T4, FREE: Free T4: 1.13 ng/dL (ref 0.82–1.77)

## 2023-01-06 NOTE — Progress Notes (Signed)
Good morning, please let Laurie Martinez know her labs have returned and overall remain stable with exception of cholesterol levels which remain elevated.  I recommend continued focus on diet and regular exercise to help lower these.  No current medication changes needed, including thyroid medication.  Great job!!  Any questions? Keep being amazing!!  Thank you for allowing me to participate in your care.  I appreciate you. Kindest regards, Johannah Rozas

## 2023-01-08 ENCOUNTER — Telehealth: Payer: Self-pay

## 2023-01-08 NOTE — Telephone Encounter (Signed)
Spoke with patient to let her know that her Biometric screening was finished, however was unable to fax due to the fact that she did not sign the form. Patient stated that she would come by and sign so that form can be faxed

## 2023-07-07 NOTE — Patient Instructions (Signed)
Be Involved in Caring For Your Health:  Taking Medications When medications are taken as directed, they can greatly improve your health. But if they are not taken as prescribed, they may not work. In some cases, not taking them correctly can be harmful. To help ensure your treatment remains effective and safe, understand your medications and how to take them. Bring your medications to each visit for review by your provider.  Your lab results, notes, and after visit summary will be available on My Chart. We strongly encourage you to use this feature. If lab results are abnormal the clinic will contact you with the appropriate steps. If the clinic does not contact you assume the results are satisfactory. You can always view your results on My Chart. If you have questions regarding your health or results, please contact the clinic during office hours. You can also ask questions on My Chart.  We at [facility] are grateful that you chose us to provide your care. We strive to provide evidence-based and compassionate care and are always looking for feedback. If you get a survey from the clinic please complete this so we can hear your opinions.  Managing Anxiety, Adult After being diagnosed with anxiety, you may be relieved to know why you have felt or behaved a certain way. You may also feel overwhelmed about the treatment ahead and what it will mean for your life. With care and support, you can manage your anxiety. How to manage lifestyle changes Understanding the difference between stress and anxiety Although stress can play a role in anxiety, it is not the same as anxiety. Stress is your body's reaction to life changes and events, both good and bad. Stress is often caused by something external, such as a deadline, test, or competition. It normally goes away after the event has ended and will last just a few hours. But, stress can be ongoing and can lead to more than just stress. Anxiety is  caused by something internal, such as imagining a terrible outcome or worrying that something will go wrong that will greatly upset you. Anxiety often does not go away even after the event is over, and it can become a long-term (chronic) worry. Lowering stress and anxiety Talk with your health care provider or a counselor to learn more about lowering anxiety and stress. They may suggest tension-reduction techniques, such as: Music. Spend time creating or listening to music that you enjoy and that inspires you. Mindfulness-based meditation. Practice being aware of your normal breaths while not trying to control your breathing. It can be done while sitting or walking. Centering prayer. Focus on a word, phrase, or sacred image that means something to you and brings you peace. Deep breathing. Expand your stomach and inhale slowly through your nose. Hold your breath for 3-5 seconds. Then breathe out slowly, letting your stomach muscles relax. Self-talk. Learn to notice and spot thought patterns that lead to anxiety reactions. Change those patterns to thoughts that feel peaceful. Muscle relaxation. Take time to tense muscles and then relax them. Choose a tension-reduction technique that fits your lifestyle and personality. These techniques take time and practice. Set aside 5-15 minutes a day to do them. Specialized therapists can offer counseling and training in these techniques. The training to help with anxiety may be covered by some insurance plans. Other things you can do to manage stress and anxiety include: Keeping a stress diary. This can help you learn what triggers your reaction and then learn ways   to manage your response. Thinking about how you react to certain situations. You may not be able to control everything, but you can control your response. Making time for activities that help you relax and not feeling guilty about spending your time in this way. Doing visual imagery. This involves  imagining or creating mental pictures to help you relax. Practicing yoga. Through yoga poses, you can lower tension and relax.  Medicines Medicines for anxiety include: Antidepressant medicines. These are usually prescribed for long-term daily control. Anti-anxiety medicines. These may be added in severe cases, especially when panic attacks occur. When used together, medicines, psychotherapy, and tension-reduction techniques may be the most effective treatment. Relationships Relationships can play a big part in helping you recover. Spend more time connecting with trusted friends and family members. Think about going to couples counseling if you have a partner, taking family education classes, or going to family therapy. Therapy can help you and others better understand your anxiety. How to recognize changes in your anxiety Everyone responds differently to treatment for anxiety. Recovery from anxiety happens when symptoms lessen and stop interfering with your daily life at home or work. This may mean that you will start to: Have better concentration and focus. Worry will interfere less in your daily thinking. Sleep better. Be less irritable. Have more energy. Have improved memory. Try to recognize when your condition is getting worse. Contact your provider if your symptoms interfere with home or work and you feel like your condition is not improving. Follow these instructions at home: Activity Exercise. Adults should: Exercise for at least 150 minutes each week. The exercise should increase your heart rate and make you sweat (moderate-intensity exercise). Do strengthening exercises at least twice a week. Get the right amount and quality of sleep. Most adults need 7-9 hours of sleep each night. Lifestyle  Eat a healthy diet that includes plenty of vegetables, fruits, whole grains, low-fat dairy products, and lean protein. Do not eat a lot of foods that are high in fats, added sugars, or salt  (sodium). Make choices that simplify your life. Do not use any products that contain nicotine or tobacco. These products include cigarettes, chewing tobacco, and vaping devices, such as e-cigarettes. If you need help quitting, ask your provider. Avoid caffeine, alcohol, and certain over-the-counter cold medicines. These may make you feel worse. Ask your pharmacist which medicines to avoid. General instructions Take over-the-counter and prescription medicines only as told by your provider. Keep all follow-up visits. This is to make sure you are managing your anxiety well or if you need more support. Where to find support You can get help and support from: Self-help groups. Online and community organizations. A trusted spiritual leader. Couples counseling. Family education classes. Family therapy. Where to find more information You may find that joining a support group helps you deal with your anxiety. The following sources can help you find counselors or support groups near you: Mental Health America: mentalhealthamerica.net Anxiety and Depression Association of America (ADAA): adaa.org National Alliance on Mental Illness (NAMI): nami.org Contact a health care provider if: You have a hard time staying focused or finishing tasks. You spend many hours a day feeling worried about everyday life. You are very tired because you cannot stop worrying. You start to have headaches or often feel tense. You have chronic nausea or diarrhea. Get help right away if: Your heart feels like it is racing. You have shortness of breath. You have thoughts of hurting yourself or others. Get help   right away if you feel like you may hurt yourself or others, or have thoughts about taking your own life. Go to your nearest emergency room or: Call 911. Call the National Suicide Prevention Lifeline at 1-800-273-8255 or 988. This is open 24 hours a day. Text the Crisis Text Line at 741741. This information is not  intended to replace advice given to you by your health care provider. Make sure you discuss any questions you have with your health care provider. Document Revised: 08/01/2022 Document Reviewed: 02/13/2021 Elsevier Patient Education  2024 Elsevier Inc.  

## 2023-07-10 ENCOUNTER — Encounter: Payer: Self-pay | Admitting: Nurse Practitioner

## 2023-07-10 ENCOUNTER — Ambulatory Visit (INDEPENDENT_AMBULATORY_CARE_PROVIDER_SITE_OTHER): Payer: Managed Care, Other (non HMO) | Admitting: Nurse Practitioner

## 2023-07-10 VITALS — BP 118/70 | HR 86 | Temp 98.5°F | Resp 18 | Ht 64.0 in | Wt 203.8 lb

## 2023-07-10 DIAGNOSIS — I10 Essential (primary) hypertension: Secondary | ICD-10-CM | POA: Diagnosis not present

## 2023-07-10 DIAGNOSIS — E063 Autoimmune thyroiditis: Secondary | ICD-10-CM

## 2023-07-10 DIAGNOSIS — F411 Generalized anxiety disorder: Secondary | ICD-10-CM | POA: Diagnosis not present

## 2023-07-10 DIAGNOSIS — Z79899 Other long term (current) drug therapy: Secondary | ICD-10-CM

## 2023-07-10 DIAGNOSIS — F331 Major depressive disorder, recurrent, moderate: Secondary | ICD-10-CM | POA: Diagnosis not present

## 2023-07-10 DIAGNOSIS — J4520 Mild intermittent asthma, uncomplicated: Secondary | ICD-10-CM

## 2023-07-10 DIAGNOSIS — E6609 Other obesity due to excess calories: Secondary | ICD-10-CM

## 2023-07-10 DIAGNOSIS — Z6834 Body mass index (BMI) 34.0-34.9, adult: Secondary | ICD-10-CM

## 2023-07-10 DIAGNOSIS — F5101 Primary insomnia: Secondary | ICD-10-CM

## 2023-07-10 MED ORDER — CLONAZEPAM 0.5 MG PO TABS: ORAL_TABLET | ORAL | 2 refills | Status: DC

## 2023-07-10 MED ORDER — SERTRALINE HCL 25 MG PO TABS: 25.0000 mg | ORAL_TABLET | Freq: Every day | ORAL | 3 refills | Status: DC

## 2023-07-10 MED ORDER — ZOLPIDEM TARTRATE 10 MG PO TABS: ORAL_TABLET | ORAL | 2 refills | Status: DC

## 2023-07-10 NOTE — Assessment & Plan Note (Addendum)
Chronic, exacerbated during season change to fall and winter (darker days). Denies SI/HI.  Continue PRN clonazepam as needed for acute events -- she uses more often in fall and winter.  Will trial starting Zoloft 25 MG daily, to see if benefit to more seasonal aspect and she can slowly wean off when season changes back to spring (March).  Has not tried this SSRI.  Educated her on this and BLACK BOX warning.  Recommend use of light therapy and showed her different things to use for this.  Obtain UDS 01/05/24, contract up to date. Follow-up in 6 weeks.

## 2023-07-10 NOTE — Assessment & Plan Note (Signed)
Chronic, stable. To continue current medication regimen as prescribed. To notify provider with change in symptoms or increased use/need of rescue inhaler.

## 2023-07-10 NOTE — Assessment & Plan Note (Signed)
Chronic, stable.  Continue current Levothyroxine dose and adjusted as needed.  Labs today -- up to date.  Discussed Hashimoto's diet changes + magnesium/selenium daily as supplements.

## 2023-07-10 NOTE — Progress Notes (Signed)
BP 118/70 (BP Location: Left Arm, Patient Position: Sitting, Cuff Size: Normal)   Pulse 86   Temp 98.5 F (36.9 C) (Oral)   Resp 18   Ht 5\' 4"  (1.626 m)   Wt 203 lb 12.8 oz (92.4 kg)   SpO2 98%   BMI 34.98 kg/m    Subjective:    Patient ID: Laurie Martinez, female    DOB: 1971/04/09, 52 y.o.   MRN: [id number]  HPI: Laurie Martinez is a 52 y.o. female  Chief Complaint  Patient presents with   Hypertension   Hypothyroidism   Asthma   Depression   Anxiety   HYPERTENSION/HLD Taking Lisinopril 5 MG daily. Hypertension status: controlled  Satisfied with current treatment? yes Duration of hypertension: chronic BP monitoring frequency: weekly BP range: 120/70-80 range on average BP medication side effects:  no Medication compliance: good compliance Aspirin: no Recurrent headaches: occasional, due to migraines,these improved with BCP Visual changes: no Palpitations: no Dyspnea: no Chest pain: no Lower extremity edema: no Dizzy/lightheaded: no  The 10-year ASCVD risk score (Arnett DK, et al., 2019) is: 1.4%   Values used to calculate the score:     Age: 28 years     Sex: Female     Is Non-Hispanic African American: No     Diabetic: No     Tobacco smoker: No     Systolic Blood Pressure: 118 mmHg     Is BP treated: Yes     HDL Cholesterol: 63 mg/dL     Total Cholesterol: 205 mg/dL    HYPOTHYROIDISM Taking Levothyroxine 50 MCG daily.   Thyroid control status:controlled Satisfied with current treatment? yes Medication side effects: no Medication compliance: good compliance Recent dose adjustment:no Fatigue: a little bit Cold intolerance: at times -- at baseline Heat intolerance: no Weight gain: no Weight loss: no Constipation: yes- chronic issue, Miralax Diarrhea/loose stools: no Palpitations: no Lower extremity edema: no Anxiety/depressed mood: yes - chronic issue   ASTHMA Uses Singulair and Advair daily and Ventolin PRN. Asthma status:  controlled Satisfied with current treatment?: yes Albuterol/rescue inhaler frequency: none over summer Dyspnea frequency: occasional Wheezing frequency: no Cough frequency: no Nocturnal symptom frequency: none Limitation of activity: none Current upper respiratory symptoms: no Triggers: seasonal allergies -- spring and fall & exercise Visits to ER or Urgent Care in past year: no Pneumovax: Up to Date Influenza: Not up to Date - refuses   DEPRESSION, ANXIETY, & INSOMNIA Continues Ambien and Klonopin. Pt reports satisfaction with current treatment options and wishes to continue. Pt is aware of risks of benzo medication use to include increased sedation, respiratory suppression, falls, dependence and cardiovascular events. Pt would like to continue treatment as benefit determined to outweigh risk.   Baseline Klonopin use is less than once a month as needed for severe symptoms.  Reports difficulty sleeping most nights, but reserves use of Ambien for weekends and when off work.  Depressive symptoms are worse in the winter -- the past two winters have been worse. On review PDMP last Ambien fill 01/12/23 and Klonopin 01/12/23. Mood status: controlled Satisfied with current treatment?: yes Symptom severity: moderate  Duration of current treatment : chronic Side effects: no Medication compliance: good compliance Psychotherapy/counseling: yes in the past -- therapist retired -- not current Previous psychiatric medications:  several medications over the past years -- has gone to psychiatry in past -- Wellbutrin, Paxil, Lamictal, Effexor Depressed mood: yes Anxious mood: yes Anhedonia: no Significant weight loss or gain: no Insomnia:  yes, takes Ambien -- has been Ashwaghanda and L-Theanine Fatigue: yes Feelings of worthlessness or guilt: no Impaired concentration/indecisiveness: yes Suicidal ideations: no Hopelessness: no Crying spells: no     07/10/2023    8:40 AM 01/05/2023    8:16 AM  07/03/2022    8:44 AM 01/02/2022    8:22 AM 06/30/2021    8:47 AM  Depression screen PHQ 2/9  Decreased Interest 2 1 3 2  0  Down, Depressed, Hopeless 2 1 3 2  0  PHQ - 2 Score 4 2 6 4  0  Altered sleeping 3 3 3 3 3   Tired, decreased energy 3 3 3 3 3   Change in appetite 3 1 0 0 0  Feeling bad or failure about yourself  3 1 1  0 0  Trouble concentrating 2 3 2 3 3   Moving slowly or fidgety/restless 1 2 0 1 0  Suicidal thoughts 0 0 0 0 0  PHQ-9 Score 19 15 15 14 9   Difficult doing work/chores Somewhat difficult   Somewhat difficult Somewhat difficult       07/10/2023    8:40 AM 01/05/2023    8:16 AM 07/03/2022    8:44 AM 01/02/2022    8:22 AM  GAD 7 : Generalized Anxiety Score  Nervous, Anxious, on Edge 2 2 3 2   Control/stop worrying 2 1 3 2   Worry too much - different things 2 2  0  Trouble relaxing 1 1 3 2   Restless 1 1  0  Easily annoyed or irritable 1 1  1   Afraid - awful might happen 0 0  0  Total GAD 7 Score 9 8  7   Anxiety Difficulty Somewhat difficult  Extremely difficult Not difficult at all     Relevant past medical, surgical, family and social history reviewed and updated as indicated. Interim medical history since our last visit reviewed. Allergies and medications reviewed and updated.  Review of Systems  Constitutional:  Negative for activity change, appetite change, diaphoresis, fatigue and fever.  Respiratory:  Negative for cough, chest tightness and shortness of breath.   Cardiovascular:  Negative for chest pain, palpitations and leg swelling.  Gastrointestinal: Negative.   Endocrine: Negative for cold intolerance and heat intolerance.  Neurological: Negative.   Psychiatric/Behavioral:  Positive for sleep disturbance. Negative for decreased concentration, self-injury and suicidal ideas. The patient is nervous/anxious.    Per HPI unless specifically indicated above     Objective:    BP 118/70 (BP Location: Left Arm, Patient Position: Sitting, Cuff Size: Normal)    Pulse 86   Temp 98.5 F (36.9 C) (Oral)   Resp 18   Ht 5\' 4"  (1.626 m)   Wt 203 lb 12.8 oz (92.4 kg)   SpO2 98%   BMI 34.98 kg/m   Wt Readings from Last 3 Encounters:  07/10/23 203 lb 12.8 oz (92.4 kg)  01/05/23 199 lb 12.8 oz (90.6 kg)  07/03/22 182 lb 9.6 oz (82.8 kg)    Physical Exam Vitals and nursing note reviewed.  Constitutional:      General: She is awake. She is not in acute distress.    Appearance: She is well-developed and well-groomed. She is obese. She is not ill-appearing or toxic-appearing.  HENT:     Head: Normocephalic.     Right Ear: Hearing normal.     Left Ear: Hearing normal.  Eyes:     General: Lids are normal.        Right eye: No discharge.  Left eye: No discharge.     Conjunctiva/sclera: Conjunctivae normal.     Pupils: Pupils are equal, round, and reactive to light.  Neck:     Thyroid: No thyromegaly.     Vascular: No carotid bruit.  Cardiovascular:     Rate and Rhythm: Normal rate and regular rhythm.     Heart sounds: Normal heart sounds. No murmur heard.    No gallop.  Pulmonary:     Effort: Pulmonary effort is normal. No accessory muscle usage or respiratory distress.     Breath sounds: Normal breath sounds.  Abdominal:     General: Bowel sounds are normal.     Palpations: Abdomen is soft.  Musculoskeletal:     Cervical back: Normal range of motion and neck supple.     Right lower leg: No edema.     Left lower leg: No edema.  Skin:    General: Skin is warm and dry.  Neurological:     Mental Status: She is alert and oriented to person, place, and time.  Psychiatric:        Attention and Perception: Attention normal.        Mood and Affect: Mood normal.        Speech: Speech normal.        Behavior: Behavior normal. Behavior is cooperative.        Thought Content: Thought content normal.    Results for orders placed or performed in visit on 01/05/23  Microalbumin, Urine Waived  Result Value Ref Range   Microalb, Ur Waived  30 (H) 0 - 19 mg/L   Creatinine, Urine Waived 10 10 - 300 mg/dL   Microalb/Creat Ratio 30-300 (H) <30 mg/g  [id number] 11+Oxyco+Alc+Crt-Bund  Result Value Ref Range   Ethanol Negative Cutoff=0.020 %   Amphetamines, Urine Negative Cutoff=1000 ng/mL   Barbiturate Negative Cutoff=200 ng/mL   BENZODIAZ UR QL Negative Cutoff=200 ng/mL   Cannabinoid Quant, Ur Negative Cutoff=50 ng/mL   Cocaine (Metabolite) Negative Cutoff=300 ng/mL   OPIATE SCREEN URINE Negative Cutoff=300 ng/mL   Oxycodone/Oxymorphone, Urine Negative Cutoff=300 ng/mL   Phencyclidine Negative Cutoff=25 ng/mL   Methadone Screen, Urine Negative Cutoff=300 ng/mL   Propoxyphene Negative Cutoff=300 ng/mL   Meperidine Negative Cutoff=200 ng/mL   Tramadol Negative Cutoff=200 ng/mL   Creatinine 44.7 20.0 - 300.0 mg/dL   pH, Urine 6.3 4.5 - 8.9  CBC with Differential/Platelet  Result Value Ref Range   WBC 7.8 3.4 - 10.8 x10E3/uL   RBC 4.26 3.77 - 5.28 x10E6/uL   Hemoglobin 13.9 11.1 - 15.9 g/dL   Hematocrit 04.5 40.9 - 46.6 %   MCV 94 79 - 97 fL   MCH 32.6 26.6 - 33.0 pg   MCHC 34.7 31.5 - 35.7 g/dL   RDW 81.1 (L) 91.4 - 78.2 %   Platelets 298 150 - 450 x10E3/uL   Neutrophils 58 Not Estab. %   Lymphs 34 Not Estab. %   Monocytes 5 Not Estab. %   Eos 2 Not Estab. %   Basos 1 Not Estab. %   Neutrophils Absolute 4.6 1.4 - 7.0 x10E3/uL   Lymphocytes Absolute 2.6 0.7 - 3.1 x10E3/uL   Monocytes Absolute 0.4 0.1 - 0.9 x10E3/uL   EOS (ABSOLUTE) 0.2 0.0 - 0.4 x10E3/uL   Basophils Absolute 0.0 0.0 - 0.2 x10E3/uL   Immature Granulocytes 0 Not Estab. %   Immature Grans (Abs) 0.0 0.0 - 0.1 x10E3/uL  Comprehensive metabolic panel  Result Value Ref Range   Glucose 92 70 -  99 mg/dL   BUN 10 6 - 24 mg/dL   Creatinine, Ser 5.62 0.57 - 1.00 mg/dL   eGFR 83 >13 YQ/MVH/8.46   BUN/Creatinine Ratio 12 9 - 23   Sodium 138 134 - 144 mmol/L   Potassium 4.4 3.5 - 5.2 mmol/L   Chloride 102 96 - 106 mmol/L   CO2 19 (L) 20 - 29 mmol/L   Calcium  9.3 8.7 - 10.2 mg/dL   Total Protein 7.2 6.0 - 8.5 g/dL   Albumin 4.5 3.8 - 4.9 g/dL   Globulin, Total 2.7 1.5 - 4.5 g/dL   Albumin/Globulin Ratio 1.7 1.2 - 2.2   Bilirubin Total <0.2 0.0 - 1.2 mg/dL   Alkaline Phosphatase 86 44 - 121 IU/L   AST 12 0 - 40 IU/L   ALT 7 0 - 32 IU/L  Lipid Panel w/o Chol/HDL Ratio  Result Value Ref Range   Cholesterol, Total 205 (H) 100 - 199 mg/dL   Triglycerides 962 0 - 149 mg/dL   HDL 63 >95 mg/dL   VLDL Cholesterol Cal 19 5 - 40 mg/dL   LDL Chol Calc (NIH) 284 (H) 0 - 99 mg/dL  TSH  Result Value Ref Range   TSH 2.070 0.450 - 4.500 uIU/mL  T4, free  Result Value Ref Range   Free T4 1.13 0.82 - 1.77 ng/dL  VITAMIN D 25 Hydroxy (Vit-D Deficiency, Fractures)  Result Value Ref Range   Vit D, 25-Hydroxy 79.4 30.0 - 100.0 ng/mL      Assessment & Plan:   Problem List Items Addressed This Visit       Cardiovascular and Mediastinum   Hypertension    Chronic, stable.  BP at goal in office today on recheck and at goal on home checks.  Continue current medication regimen and adjust as needed.  Recommend she monitor BP at least a few mornings a week at home and document.  DASH diet at home.  Refills up to date.  Labs today: up to date.           Respiratory   Asthma    Chronic, stable. To continue current medication regimen as prescribed. To notify provider with change in symptoms or increased use/need of rescue inhaler.         Endocrine   Hashimoto's thyroiditis    Chronic, stable.  Continue current Levothyroxine dose and adjusted as needed.  Labs today -- up to date.  Discussed Hashimoto's diet changes + magnesium/selenium daily as supplements.         Other   Depression - Primary    Chronic, exacerbated during season change to fall and winter (darker days). Denies SI/HI.  Continue PRN clonazepam as needed for acute events -- she uses more often in fall and winter.  Will trial starting Zoloft 25 MG daily, to see if benefit to more seasonal  aspect and she can slowly wean off when season changes back to spring (March).  Has not tried this SSRI.  Educated her on this and BLACK BOX warning.  Recommend use of light therapy and showed her different things to use for this.  Obtain UDS 01/05/24, contract up to date. Follow-up in 6 weeks.      Relevant Medications   sertraline (ZOLOFT) 25 MG tablet   Generalized anxiety disorder    Refer to depression plan of care.  Goal is to return to reduced dosing benzo.      Relevant Medications   sertraline (ZOLOFT) 25 MG tablet  Insomnia    Chronic, stable with intermittent Ambien use.  Discussed sleep hygiene methods that may benefit her. Contract up to date for controlled substance and UDS next 01/05/24.  Refills sent.        Long-term current use of benzodiazepine    Obtain UDS next 01/05/24.  Discussed at length risk of long term benzo use, at this time she uses appropriately, minimally.  Recommend return to minimal use.      Obesity    BMI 34.98.  Recommended eating smaller high protein, low fat meals more frequently and exercising 30 mins a day 5 times a week with a goal of 10-15lb weight loss in the next 3 months. Patient voiced their understanding and motivation to adhere to these recommendations.         Follow up plan: Return in about 6 weeks (around 08/21/2023) for Depression -- added on Zoloft + schedule annual physical after 01/05/24.

## 2023-07-10 NOTE — Assessment & Plan Note (Signed)
Chronic, stable with intermittent Ambien use.  Discussed sleep hygiene methods that may benefit her. Contract up to date for controlled substance and UDS next 01/05/24.  Refills sent.

## 2023-07-10 NOTE — Assessment & Plan Note (Signed)
BMI 34.98.  Recommended eating smaller high protein, low fat meals more frequently and exercising 30 mins a day 5 times a week with a goal of 10-15lb weight loss in the next 3 months. Patient voiced their understanding and motivation to adhere to these recommendations.

## 2023-07-10 NOTE — Assessment & Plan Note (Signed)
Obtain UDS next 01/05/24.  Discussed at length risk of long term benzo use, at this time she uses appropriately, minimally.  Recommend return to minimal use.

## 2023-07-10 NOTE — Assessment & Plan Note (Signed)
Refer to depression plan of care.  Goal is to return to reduced dosing benzo.

## 2023-07-10 NOTE — Assessment & Plan Note (Signed)
Chronic, stable.  BP at goal in office today on recheck and at goal on home checks.  Continue current medication regimen and adjust as needed.  Recommend she monitor BP at least a few mornings a week at home and document.  DASH diet at home.  Refills up to date.  Labs today: up to date.

## 2023-07-13 ENCOUNTER — Other Ambulatory Visit: Payer: Self-pay | Admitting: Nurse Practitioner

## 2023-07-13 DIAGNOSIS — Z1231 Encounter for screening mammogram for malignant neoplasm of breast: Secondary | ICD-10-CM

## 2023-08-08 ENCOUNTER — Other Ambulatory Visit: Payer: Self-pay | Admitting: Nurse Practitioner

## 2023-08-08 NOTE — Telephone Encounter (Signed)
Medication Refill - Medication:  SUMAtriptan (IMITREX) 20 MG/ACT nasal spray  *completely out   Has the patient contacted their pharmacy? Yes, advised to contact PCP  Preferred Pharmacy (with phone number or street name):  [facility] - [place], [place] - [address]  Phone: [phone] Fax: [phone]   Has the patient been seen for an appointment in the last year OR does the patient have an upcoming appointment? Yes, F/U with PCP on 10.15.24

## 2023-08-08 NOTE — Telephone Encounter (Signed)
Requested medication (s) are due for refill today: for review  Requested medication (s) are on the active medication list: yes    Last refill: 05/20/20  6  3  refills  Future visit scheduled yes 08/21/23  Notes to clinic:Expired, please review, thank you.  Requested Prescriptions  Pending Prescriptions Disp Refills   SUMAtriptan (IMITREX) 20 MG/ACT nasal spray 6 each 3    Sig: Place 1 spray (20 mg total) into the nose once for 1 dose. May repeat in 2 hours if headache persists or recurs.     Neurology:  Migraine Therapy - Triptan Passed - 08/08/2023 10:24 AM      Passed - Last BP in normal range    BP Readings from Last 1 Encounters:  07/10/23 118/70         Passed - Valid encounter within last 12 months    Recent Outpatient Visits           4 weeks ago Moderate episode of recurrent major depressive disorder (HCC)   [facility] [place], Jolene T, NP   7 months ago Mild episode of recurrent major depressive disorder (HCC)   [place] [facility] [place], Corrie Dandy T, NP   1 year ago Mild episode of recurrent major depressive disorder (HCC)   [place] [facility] [place], Corrie Dandy T, NP   1 year ago Mild episode of recurrent major depressive disorder (HCC)   [facility] [place], [place] T, NP   2 years ago Mild episode of recurrent major depressive disorder (HCC)   [place] [facility] [place], Dorie Rank, NP       Future Appointments             In 1 week Cannady, Dorie Rank, NP [facility]   In 5 months [place], Dorie Rank, NP [facility]

## 2023-08-09 MED ORDER — SUMATRIPTAN 20 MG/ACT NA SOLN: 20.0000 mg | Freq: Once | NASAL | 3 refills | Status: DC

## 2023-08-19 NOTE — Patient Instructions (Signed)
Be Involved in Caring For Your Health:  Taking Medications When medications are taken as directed, they can greatly improve your health. But if they are not taken as prescribed, they may not work. In some cases, not taking them correctly can be harmful. To help ensure your treatment remains effective and safe, understand your medications and how to take them. Bring your medications to each visit for review by your provider.  Your lab results, notes, and after visit summary will be available on My Chart. We strongly encourage you to use this feature. If lab results are abnormal the clinic will contact you with the appropriate steps. If the clinic does not contact you assume the results are satisfactory. You can always view your results on My Chart. If you have questions regarding your health or results, please contact the clinic during office hours. You can also ask questions on My Chart.  We at [facility] are grateful that you chose [place] to provide your care. We strive to provide evidence-based and compassionate care and are always looking for feedback. If you get a survey from the clinic please complete this so we can hear your opinions.  Managing Depression, Adult Depression is a mental health condition that affects your thoughts, feelings, and actions. Being diagnosed with depression can bring you relief if you did not know why you have felt or behaved a certain way. It could also leave you feeling overwhelmed. Finding ways to manage your symptoms can help you feel more positive about your future. How to manage lifestyle changes Being depressed is difficult. Depression can increase the level of everyday stress. Stress can make depression symptoms worse. You may believe your symptoms cannot be managed or will never improve. However, there are many things you can try to help manage your symptoms. There is hope. Managing stress  Stress is your body's reaction to life changes and events,  both good and bad. Stress can add to your feelings of depression. Learning to manage your stress can help lessen your feelings of depression. Try some of the following approaches to reducing your stress (stress reduction techniques): Listen to music that you enjoy and that inspires you. Try using a meditation app or take a meditation class. Develop a practice that helps you connect with your spiritual self. Walk in nature, pray, or go to a place of worship. Practice deep breathing. To do this, inhale slowly through your nose. Pause at the top of your inhale for a few seconds and then exhale slowly, letting yourself relax. Repeat this three or four times. Practice yoga to help relax and work your muscles. Choose a stress reduction technique that works for you. These techniques take time and practice to develop. Set aside 5-15 minutes a day to do them. Therapists can offer training in these techniques. Do these things to help manage stress: Keep a journal. Know your limits. Set healthy boundaries for yourself and others, such as saying "no" when you think something is too much. Pay attention to how you react to certain situations. You may not be able to control everything, but you can change your reaction. Add humor to your life by watching funny movies or shows. Make time for activities that you enjoy and that relax you. Spend less time using electronics, especially at night before bed. The light from screens can make your brain think it is time to get up rather than go to bed.  Medicines Medicines, such as antidepressants, are often a part of  treatment for depression. Talk with your pharmacist or health care provider about all the medicines, supplements, and herbal products that you take, their possible side effects, and what medicines and other products are safe to take together. Make sure to report any side effects you may have to your health care provider. Relationships Your health care  provider may suggest family therapy, couples therapy, or individual therapy as part of your treatment. How to recognize changes Everyone responds differently to treatment for depression. As you recover from depression, you may start to: Have more interest in doing activities. Feel more hopeful. Have more energy. Eat a more regular amount of food. Have better mental focus. It is important to recognize if your depression is not getting better or is getting worse. The symptoms you had in the beginning may return, such as: Feeling tired. Eating too much or too little. Sleeping too much or too little. Feeling restless, agitated, or hopeless. Trouble focusing or making decisions. Having unexplained aches and pains. Feeling irritable, angry, or aggressive. If you or your family members notice these symptoms coming back, let your health care provider know right away. Follow these instructions at home: Activity Try to get some form of exercise each day, such as walking. Try yoga, mindfulness, or other stress reduction techniques. Participate in group activities if you are able. Lifestyle Get enough sleep. Cut down on or stop using caffeine, tobacco, alcohol, and any other harmful substances. Eat a healthy diet that includes plenty of vegetables, fruits, whole grains, low-fat dairy products, and lean protein. Limit foods that are high in solid fats, added sugar, or salt (sodium). General instructions Take over-the-counter and prescription medicines only as told by your health care provider. Keep all follow-up visits. It is important for your health care provider to check on your mood, behavior, and medicines. Your health care provider may need to make changes to your treatment. Where to find support Talking to others  Friends and family members can be sources of support and guidance. Talk to trusted friends or family members about your condition. Explain your symptoms and let them know that you  are working with a health care provider to treat your depression. Tell friends and family how they can help. Finances Find mental health providers that fit with your financial situation. Talk with your health care provider if you are worried about access to food, housing, or medicine. Call your insurance company to learn about your co-pays and prescription plan. Where to find more information You can find support in your area from: Anxiety and Depression Association of America (ADAA): adaa.org Mental Health America: mentalhealthamerica.net The [employer] on Mental Illness: nami.org Contact a health care provider if: You stop taking your antidepressant medicines, and you have any of these symptoms: Nausea. Headache. Light-headedness. Chills and body aches. Not being able to sleep (insomnia). You or your friends and family think your depression is getting worse. Get help right away if: You have thoughts of hurting yourself or others. Get help right away if you feel like you may hurt yourself or others, or have thoughts about taking your own life. Go to your nearest emergency room or: Call 911. Call the National Suicide Prevention Lifeline at 636-278-1833 or 988. This is open 24 hours a day. Text the Crisis Text Line at (706)337-7385. This information is not intended to replace advice given to you by your health care provider. Make sure you discuss any questions you have with your health care provider. Document Revised: 02/28/2022 Document Reviewed:  02/28/2022 Elsevier Patient Education  2024 [employer].

## 2023-08-21 ENCOUNTER — Encounter: Payer: Self-pay | Admitting: Nurse Practitioner

## 2023-08-21 ENCOUNTER — Ambulatory Visit (INDEPENDENT_AMBULATORY_CARE_PROVIDER_SITE_OTHER): Payer: Managed Care, Other (non HMO) | Admitting: Nurse Practitioner

## 2023-08-21 VITALS — BP 123/77 | HR 64 | Temp 98.5°F | Wt 206.2 lb

## 2023-08-21 DIAGNOSIS — F411 Generalized anxiety disorder: Secondary | ICD-10-CM

## 2023-08-21 DIAGNOSIS — F331 Major depressive disorder, recurrent, moderate: Secondary | ICD-10-CM | POA: Diagnosis not present

## 2023-08-21 MED ORDER — SERTRALINE HCL 25 MG PO TABS: ORAL_TABLET | ORAL | 4 refills | Status: DC

## 2023-08-21 NOTE — Progress Notes (Signed)
BP 123/77 (BP Location: Left Arm, Cuff Size: Normal)   Pulse 64   Temp 98.5 F (36.9 C) (Oral)   Wt 206 lb 3.2 oz (93.5 kg)   SpO2 97%   BMI 35.39 kg/m    Subjective:    Patient ID: Laurie Martinez, female    DOB: 1970/12/10, 52 y.o.   MRN: [id number]  HPI: Laurie Martinez is a 52 y.o. female  Chief Complaint  Patient presents with   Depression    6 week f/up   DEPRESSION Follow-up today after starting Zoloft 25 MG at recent visit, as her depression is worse in winter and past 2 winters have been difficult.  Is noticing benefit with this thus far, initially had a bit of upset stomach with this. She continues on Klonopin and Ambien. Pt reports satisfaction with current treatment options and wishes to continue. Pt is aware of risks of benzo medication use to include increased sedation, respiratory suppression, falls, dependence and cardiovascular events. Pt would like to continue treatment as benefit determined to outweigh risk.   Mood status: stable Satisfied with current treatment?: yes Symptom severity: moderate  Duration of current treatment : chronic Side effects: no Medication compliance: good compliance Psychotherapy/counseling: yes in the past Previous psychiatric medications: several medications over the past years -- has gone to psychiatry in past -- Wellbutrin, Paxil, Lamictal, Effexor  Depressed mood: thus far is stable Anxious mood: occasional Anhedonia: no Significant weight loss or gain: no Insomnia: yes hard to fall asleep -- Ambien Fatigue:  occasional, ongoing since Covid Feelings of worthlessness or guilt: no Impaired concentration/indecisiveness: no Suicidal ideations: no Hopelessness: no Crying spells: no    08/21/2023    8:25 AM 07/10/2023    8:40 AM 01/05/2023    8:16 AM 07/03/2022    8:44 AM 01/02/2022    8:22 AM  Depression screen PHQ 2/9  Decreased Interest 1 2 1 3 2   Down, Depressed, Hopeless 1 2 1 3 2   PHQ - 2 Score 2 4 2 6 4   Altered sleeping  3 3 3 3 3   Tired, decreased energy 3 3 3 3 3   Change in appetite 1 3 1  0 0  Feeling bad or failure about yourself  0 3 1 1  0  Trouble concentrating 2 2 3 2 3   Moving slowly or fidgety/restless 1 1 2  0 1  Suicidal thoughts 0 0 0 0 0  PHQ-9 Score 12 19 15 15 14   Difficult doing work/chores Somewhat difficult Somewhat difficult   Somewhat difficult       08/21/2023    8:25 AM 07/10/2023    8:40 AM 01/05/2023    8:16 AM 07/03/2022    8:44 AM  GAD 7 : Generalized Anxiety Score  Nervous, Anxious, on Edge 2 2 2 3   Control/stop worrying 1 2 1 3   Worry too much - different things 1 2 2    Trouble relaxing 1 1 1 3   Restless 1 1 1    Easily annoyed or irritable 1 1 1    Afraid - awful might happen 0 0 0   Total GAD 7 Score 7 9 8    Anxiety Difficulty Somewhat difficult Somewhat difficult  Extremely difficult     Relevant past medical, surgical, family and social history reviewed and updated as indicated. Interim medical history since our last visit reviewed. Allergies and medications reviewed and updated.  Review of Systems  Constitutional:  Negative for activity change, appetite change, diaphoresis, fatigue and fever.  Respiratory:  Negative for cough, chest tightness and shortness of breath.   Cardiovascular:  Negative for chest pain, palpitations and leg swelling.  Gastrointestinal: Negative.   Endocrine: Negative for cold intolerance and heat intolerance.  Neurological: Negative.   Psychiatric/Behavioral:  Positive for sleep disturbance. Negative for decreased concentration, self-injury and suicidal ideas. The patient is nervous/anxious.     Per HPI unless specifically indicated above     Objective:    BP 123/77 (BP Location: Left Arm, Cuff Size: Normal)   Pulse 64   Temp 98.5 F (36.9 C) (Oral)   Wt 206 lb 3.2 oz (93.5 kg)   SpO2 97%   BMI 35.39 kg/m   Wt Readings from Last 3 Encounters:  08/21/23 206 lb 3.2 oz (93.5 kg)  07/10/23 203 lb 12.8 oz (92.4 kg)  01/05/23 199 lb  12.8 oz (90.6 kg)    Physical Exam Vitals and nursing note reviewed.  Constitutional:      General: She is awake. She is not in acute distress.    Appearance: She is well-developed and well-groomed. She is obese. She is not ill-appearing or toxic-appearing.  HENT:     Head: Normocephalic.     Right Ear: Hearing normal.     Left Ear: Hearing normal.  Eyes:     General: Lids are normal.        Right eye: No discharge.        Left eye: No discharge.     Conjunctiva/sclera: Conjunctivae normal.     Pupils: Pupils are equal, round, and reactive to light.  Neck:     Thyroid: No thyromegaly.     Vascular: No carotid bruit.  Cardiovascular:     Rate and Rhythm: Normal rate and regular rhythm.     Heart sounds: Normal heart sounds. No murmur heard.    No gallop.  Pulmonary:     Effort: Pulmonary effort is normal. No accessory muscle usage or respiratory distress.     Breath sounds: Normal breath sounds.  Abdominal:     General: Bowel sounds are normal.     Palpations: Abdomen is soft.  Musculoskeletal:     Cervical back: Normal range of motion and neck supple.     Right lower leg: No edema.     Left lower leg: No edema.  Skin:    General: Skin is warm and dry.  Neurological:     Mental Status: She is alert and oriented to person, place, and time.  Psychiatric:        Attention and Perception: Attention normal.        Mood and Affect: Mood normal.        Speech: Speech normal.        Behavior: Behavior normal. Behavior is cooperative.        Thought Content: Thought content normal.     Results for orders placed or performed in visit on 01/05/23  Microalbumin, Urine Waived  Result Value Ref Range   Microalb, Ur Waived 30 (H) 0 - 19 mg/L   Creatinine, Urine Waived 10 10 - 300 mg/dL   Microalb/Creat Ratio 30-300 (H) <30 mg/g  [id number] 11+Oxyco+Alc+Crt-Bund  Result Value Ref Range   Ethanol Negative Cutoff=0.020 %   Amphetamines, Urine Negative Cutoff=1000 ng/mL   Barbiturate  Negative Cutoff=200 ng/mL   BENZODIAZ UR QL Negative Cutoff=200 ng/mL   Cannabinoid Quant, Ur Negative Cutoff=50 ng/mL   Cocaine (Metabolite) Negative Cutoff=300 ng/mL   OPIATE SCREEN URINE Negative Cutoff=300 ng/mL  Oxycodone/Oxymorphone, Urine Negative Cutoff=300 ng/mL   Phencyclidine Negative Cutoff=25 ng/mL   Methadone Screen, Urine Negative Cutoff=300 ng/mL   Propoxyphene Negative Cutoff=300 ng/mL   Meperidine Negative Cutoff=200 ng/mL   Tramadol Negative Cutoff=200 ng/mL   Creatinine 44.7 20.0 - 300.0 mg/dL   pH, Urine 6.3 4.5 - 8.9  CBC with Differential/Platelet  Result Value Ref Range   WBC 7.8 3.4 - 10.8 x10E3/uL   RBC 4.26 3.77 - 5.28 x10E6/uL   Hemoglobin 13.9 11.1 - 15.9 g/dL   Hematocrit 16.1 09.6 - 46.6 %   MCV 94 79 - 97 fL   MCH 32.6 26.6 - 33.0 pg   MCHC 34.7 31.5 - 35.7 g/dL   RDW 04.5 (L) 40.9 - 81.1 %   Platelets 298 150 - 450 x10E3/uL   Neutrophils 58 Not Estab. %   Lymphs 34 Not Estab. %   Monocytes 5 Not Estab. %   Eos 2 Not Estab. %   Basos 1 Not Estab. %   Neutrophils Absolute 4.6 1.4 - 7.0 x10E3/uL   Lymphocytes Absolute 2.6 0.7 - 3.1 x10E3/uL   Monocytes Absolute 0.4 0.1 - 0.9 x10E3/uL   EOS (ABSOLUTE) 0.2 0.0 - 0.4 x10E3/uL   Basophils Absolute 0.0 0.0 - 0.2 x10E3/uL   Immature Granulocytes 0 Not Estab. %   Immature Grans (Abs) 0.0 0.0 - 0.1 x10E3/uL  Comprehensive metabolic panel  Result Value Ref Range   Glucose 92 70 - 99 mg/dL   BUN 10 6 - 24 mg/dL   Creatinine, Ser 9.14 0.57 - 1.00 mg/dL   eGFR 83 >78 GN/FAO/1.30   BUN/Creatinine Ratio 12 9 - 23   Sodium 138 134 - 144 mmol/L   Potassium 4.4 3.5 - 5.2 mmol/L   Chloride 102 96 - 106 mmol/L   CO2 19 (L) 20 - 29 mmol/L   Calcium 9.3 8.7 - 10.2 mg/dL   Total Protein 7.2 6.0 - 8.5 g/dL   Albumin 4.5 3.8 - 4.9 g/dL   Globulin, Total 2.7 1.5 - 4.5 g/dL   Albumin/Globulin Ratio 1.7 1.2 - 2.2   Bilirubin Total <0.2 0.0 - 1.2 mg/dL   Alkaline Phosphatase 86 44 - 121 IU/L   AST 12 0 - 40  IU/L   ALT 7 0 - 32 IU/L  Lipid Panel w/o Chol/HDL Ratio  Result Value Ref Range   Cholesterol, Total 205 (H) 100 - 199 mg/dL   Triglycerides 865 0 - 149 mg/dL   HDL 63 >78 mg/dL   VLDL Cholesterol Cal 19 5 - 40 mg/dL   LDL Chol Calc (NIH) 469 (H) 0 - 99 mg/dL  TSH  Result Value Ref Range   TSH 2.070 0.450 - 4.500 uIU/mL  T4, free  Result Value Ref Range   Free T4 1.13 0.82 - 1.77 ng/dL  VITAMIN D 25 Hydroxy (Vit-D Deficiency, Fractures)  Result Value Ref Range   Vit D, 25-Hydroxy 79.4 30.0 - 100.0 ng/mL      Assessment & Plan:   Problem List Items Addressed This Visit       Other   Depression - Primary    Chronic, exacerbated during season change to fall and winter (darker days). Denies SI/HI.  Continue PRN clonazepam as needed for acute events -- she uses more often in fall and winter.  Will continue Zoloft 25 MG daily as is offering benefit (scores have reduced and she reports improved mood), have written so she can go up to 50 MG as needed in upcoming weeks with  time change.  She can slowly wean off when season changes back to spring (March).  Educated her on this and BLACK BOX warning.  Recommend use of light therapy and showed her different things to use for this.  Obtain UDS 01/05/24, contract up to date. Follow-up in March for physical, sooner if needed.      Relevant Medications   sertraline (ZOLOFT) 25 MG tablet   Generalized anxiety disorder    Refer to depression plan of care.  Goal is to return to reduced dosing benzo.      Relevant Medications   sertraline (ZOLOFT) 25 MG tablet     Follow up plan: Return in about 5 months (around 01/07/2024) for Annual physical after 01/05/24.

## 2023-08-21 NOTE — Assessment & Plan Note (Signed)
Refer to depression plan of care.  Goal is to return to reduced dosing benzo.

## 2023-08-21 NOTE — Assessment & Plan Note (Signed)
Chronic, exacerbated during season change to fall and winter (darker days). Denies SI/HI.  Continue PRN clonazepam as needed for acute events -- she uses more often in fall and winter.  Will continue Zoloft 25 MG daily as is offering benefit (scores have reduced and she reports improved mood), have written so she can go up to 50 MG as needed in upcoming weeks with time change.  She can slowly wean off when season changes back to spring (March).  Educated her on this and BLACK BOX warning.  Recommend use of light therapy and showed her different things to use for this.  Obtain UDS 01/05/24, contract up to date. Follow-up in March for physical, sooner if needed.

## 2023-08-30 ENCOUNTER — Ambulatory Visit
Admission: RE | Admit: 2023-08-30 | Discharge: 2023-08-30 | Disposition: A | Discharge status: Home / Self Care | Payer: Managed Care, Other (non HMO) | Source: Ambulatory Visit | Attending: Nurse Practitioner | Admitting: Nurse Practitioner

## 2023-08-30 DIAGNOSIS — Z1231 Encounter for screening mammogram for malignant neoplasm of breast: Secondary | ICD-10-CM | POA: Diagnosis present

## 2023-09-08 ENCOUNTER — Other Ambulatory Visit: Payer: Self-pay | Admitting: Nurse Practitioner

## 2023-09-10 NOTE — Telephone Encounter (Signed)
Requested medication (s) are due for refill today: no Due 10/09/23, both meds  Requested medication (s) are on the active medication list: yes  Last refill: Both meds 07/10/23  #30  2 refills  Future visit scheduled Yes 01/10/24  Notes to clinic: Not due, cannot refuse non-delegated meds. Request sent via Interface.  Requested Prescriptions  Pending Prescriptions Disp Refills   zolpidem (AMBIEN) 10 MG tablet [Pharmacy Med Name: ZOLPIDEM TARTRATE 10 MG TAB] 30 tablet     Sig: TAKE 1 TABLET BY MOUTH AT BEDTIME AS NEEDED FOR SLEEP     Not Delegated - Psychiatry:  Anxiolytics/Hypnotics Failed - 09/08/2023  5:30 PM      Failed - This refill cannot be delegated      Passed - Urine Drug Screen completed in last 360 days      Passed - Valid encounter within last 6 months    Recent Outpatient Visits           2 weeks ago Moderate episode of recurrent major depressive disorder (HCC)   [facility] [facility] [place], Jolene T, NP   2 months ago Moderate episode of recurrent major depressive disorder (HCC)   [place] [facility] [place], Jolene T, NP   8 months ago Mild episode of recurrent major depressive disorder (HCC)   [place] [facility] [place], Corrie Dandy T, NP   1 year ago Mild episode of recurrent major depressive disorder (HCC)   [place] [facility] [place], Corrie Dandy T, NP   1 year ago Mild episode of recurrent major depressive disorder (HCC)   [place] [facility] [place], Dorie Rank, NP       Future Appointments             In 4 months Cannady, Dorie Rank, NP [place] [facility]             clonazePAM (KLONOPIN) 0.5 MG tablet [Pharmacy Med Name: CLONAZEPAM 0.5 MG TAB] 30 tablet     Sig: TAKE 1 TABLET BY MOUTH ONCE DAILY AS NEEDED FOR ANXIETY     Not Delegated - Psychiatry: Anxiolytics/Hypnotics 2 Failed - 09/08/2023  5:30 PM      Failed - This refill cannot be delegated       Passed - Urine Drug Screen completed in last 360 days      Passed - Patient is not pregnant      Passed - Valid encounter within last 6 months    Recent Outpatient Visits           2 weeks ago Moderate episode of recurrent major depressive disorder (HCC)   [place] [facility] [place], Jolene T, NP   2 months ago Moderate episode of recurrent major depressive disorder (HCC)   [place] [facility] [place], Jolene T, NP   8 months ago Mild episode of recurrent major depressive disorder (HCC)   [place] [facility] [place], [place] T, NP   1 year ago Mild episode of recurrent major depressive disorder (HCC)   [place] [facility] [place], Jolene T, NP   1 year ago Mild episode of recurrent major depressive disorder (HCC)   [place] [facility] [place], Dorie Rank, NP       Future Appointments             In 4 months Cannady, Dorie Rank, NP [facility]

## 2024-01-06 NOTE — Patient Instructions (Signed)
 Be Involved in Caring For Your Health:  Taking Medications When medications are taken as directed, they can greatly improve your health. But if they are not taken as prescribed, they may not work. In some cases, not taking them correctly can be harmful. To help ensure your treatment remains effective and safe, understand your medications and how to take them. Bring your medications to each visit for review by your provider.  Your lab results, notes, and after visit summary will be available on My Chart. We strongly encourage you to use this feature. If lab results are abnormal the clinic will contact you with the appropriate steps. If the clinic does not contact you assume the results are satisfactory. You can always view your results on My Chart. If you have questions regarding your health or results, please contact the clinic during office hours. You can also ask questions on My Chart.  We at [facility] are grateful that you chose [place] to provide your care. We strive to provide evidence-based and compassionate care and are always looking for feedback. If you get a survey from the clinic please complete this so we can hear your opinions.  Healthy Eating, Adult Healthy eating may help you get and keep a healthy body weight, reduce the risk of chronic disease, and live a long and productive life. It is important to follow a healthy eating pattern. Your nutritional and calorie needs should be met mainly by different nutrient-rich foods. What are tips for following this plan? Reading food labels Read labels and choose the following: Reduced or low sodium products. Juices with 100% fruit juice. Foods with low saturated fats (<3 g per serving) and high polyunsaturated and monounsaturated fats. Foods with whole grains, such as whole wheat, cracked wheat, brown rice, and wild rice. Whole grains that are fortified with folic acid. This is recommended for females who are pregnant or who want  to become pregnant. Read labels and do not eat or drink the following: Foods or drinks with added sugars. These include foods that contain brown sugar, corn sweetener, corn syrup, dextrose, fructose, glucose, high-fructose corn syrup, honey, invert sugar, lactose, malt syrup, maltose, molasses, raw sugar, sucrose, trehalose, or turbinado sugar. Limit your intake of added sugars to less than 10% of your total daily calories. Do not eat more than the following amounts of added sugar per day: 6 teaspoons (25 g) for females. 9 teaspoons (38 g) for males. Foods that contain processed or refined starches and grains. Refined grain products, such as white flour, degermed cornmeal, white bread, and white rice. Shopping Choose nutrient-rich snacks, such as vegetables, whole fruits, and nuts. Avoid high-calorie and high-sugar snacks, such as potato chips, fruit snacks, and candy. Use oil-based dressings and spreads on foods instead of solid fats such as butter, margarine, sour cream, or cream cheese. Limit pre-made sauces, mixes, and "instant" products such as flavored rice, instant noodles, and ready-made pasta. Try more plant-protein sources, such as tofu, tempeh, black beans, edamame, lentils, nuts, and seeds. Explore eating plans such as the Mediterranean diet or vegetarian diet. Try heart-healthy dips made with beans and healthy fats like hummus and guacamole. Vegetables go great with these. Cooking Use oil to saut or stir-fry foods instead of solid fats such as butter, margarine, or lard. Try baking, boiling, grilling, or broiling instead of frying. Remove the fatty part of meats before cooking. Steam vegetables in water or broth. Meal planning  At meals, imagine dividing your plate into fourths: One-half of  your plate is fruits and vegetables. One-fourth of your plate is whole grains. One-fourth of your plate is protein, especially lean meats, poultry, eggs, tofu, beans, or nuts. Include  low-fat dairy as part of your daily diet. Lifestyle Choose healthy options in all settings, including home, work, school, restaurants, or stores. Prepare your food safely: Wash your hands after handling raw meats. Where you prepare food, keep surfaces clean by regularly washing with hot, soapy water. Keep raw meats separate from ready-to-eat foods, such as fruits and vegetables. Cook seafood, meat, poultry, and eggs to the recommended temperature. Get a food thermometer. Store foods at safe temperatures. In general: Keep cold foods at 76F (4.4C) or below. Keep hot foods at 176F (60C) or above. Keep your freezer at [facility] (-17.8C) or below. Foods are not safe to eat if they have been between the temperatures of 40-176F (4.4-60C) for more than 2 hours. What foods should I eat? Fruits Aim to eat 1-2 cups of fresh, canned (in natural juice), or frozen fruits each day. One cup of fruit equals 1 small apple, 1 large banana, 8 large strawberries, 1 cup (237 g) canned fruit,  cup (82 g) dried fruit, or 1 cup (240 mL) 100% juice. Vegetables Aim to eat 2-4 cups of fresh and frozen vegetables each day, including different varieties and colors. One cup of vegetables equals 1 cup (91 g) broccoli or cauliflower florets, 2 medium carrots, 2 cups (150 g) raw, leafy greens, 1 large tomato, 1 large bell pepper, 1 large sweet potato, or 1 medium white potato. Grains Aim to eat 5-10 ounce-equivalents of whole grains each day. Examples of 1 ounce-equivalent of grains include 1 slice of bread, 1 cup (40 g) ready-to-eat cereal, 3 cups (24 g) popcorn, or  cup (93 g) cooked rice. Meats and other proteins Try to eat 5-7 ounce-equivalents of protein each day. Examples of 1 ounce-equivalent of protein include 1 egg,  oz nuts (12 almonds, 24 pistachios, or 7 walnut halves), 1/4 cup (90 g) cooked beans, 6 tablespoons (90 g) hummus or 1 tablespoon (16 g) peanut butter. A cut of meat or fish that is the size of a deck  of cards is about 3-4 ounce-equivalents (85 g). Of the protein you eat each week, try to have at least 8 sounce (227 g) of seafood. This is about 2 servings per week. This includes salmon, trout, herring, sardines, and anchovies. Dairy Aim to eat 3 cup-equivalents of fat-free or low-fat dairy each day. Examples of 1 cup-equivalent of dairy include 1 cup (240 mL) milk, 8 ounces (250 g) yogurt, 1 ounces (44 g) natural cheese, or 1 cup (240 mL) fortified soy milk. Fats and oils Aim for about 5 teaspoons (21 g) of fats and oils per day. Choose monounsaturated fats, such as canola and olive oils, mayonnaise made with olive oil or avocado oil, avocados, peanut butter, and most nuts, or polyunsaturated fats, such as sunflower, corn, and soybean oils, walnuts, pine nuts, sesame seeds, sunflower seeds, and flaxseed. Beverages Aim for 6 eight-ounce glasses of water per day. Limit coffee to 3-5 eight-ounce cups per day. Limit caffeinated beverages that have added calories, such as soda and energy drinks. If you drink alcohol: Limit how much you have to: 0-1 drink a day if you are female. 0-2 drinks a day if you are female. Know how much alcohol is in your drink. In the [place] one drink is one 12 oz bottle of beer (355 mL), one 5 oz glass of wine (  148 mL), or one 1 oz glass of hard liquor (44 mL). Seasoning and other foods Try not to add too much salt to your food. Try using herbs and spices instead of salt. Try not to add sugar to food. This information is based on [place] nutrition guidelines. To learn more, visit [employer]. Exact amounts may vary. You may need different amounts. This information is not intended to replace advice given to you by your health care provider. Make sure you discuss any questions you have with your health care provider. Document Revised: 07/24/2022 Document Reviewed: 07/24/2022 Elsevier Patient Education  2024 [employer].

## 2024-01-10 ENCOUNTER — Telehealth: Payer: Self-pay | Admitting: Nurse Practitioner

## 2024-01-10 ENCOUNTER — Encounter: Payer: Self-pay | Admitting: Nurse Practitioner

## 2024-01-10 ENCOUNTER — Ambulatory Visit: Payer: Managed Care, Other (non HMO) | Admitting: Nurse Practitioner

## 2024-01-10 VITALS — BP 108/70 | HR 81 | Temp 98.0°F | Ht 64.0 in | Wt 210.0 lb

## 2024-01-10 DIAGNOSIS — E063 Autoimmune thyroiditis: Secondary | ICD-10-CM | POA: Diagnosis not present

## 2024-01-10 DIAGNOSIS — Z0000 Encounter for general adult medical examination without abnormal findings: Secondary | ICD-10-CM

## 2024-01-10 DIAGNOSIS — F5101 Primary insomnia: Secondary | ICD-10-CM

## 2024-01-10 DIAGNOSIS — E66811 Obesity, class 1: Secondary | ICD-10-CM

## 2024-01-10 DIAGNOSIS — E7800 Pure hypercholesterolemia, unspecified: Secondary | ICD-10-CM

## 2024-01-10 DIAGNOSIS — E559 Vitamin D deficiency, unspecified: Secondary | ICD-10-CM

## 2024-01-10 DIAGNOSIS — I10 Essential (primary) hypertension: Secondary | ICD-10-CM

## 2024-01-10 DIAGNOSIS — Z79899 Other long term (current) drug therapy: Secondary | ICD-10-CM

## 2024-01-10 DIAGNOSIS — F331 Major depressive disorder, recurrent, moderate: Secondary | ICD-10-CM | POA: Diagnosis not present

## 2024-01-10 DIAGNOSIS — F411 Generalized anxiety disorder: Secondary | ICD-10-CM

## 2024-01-10 DIAGNOSIS — J4520 Mild intermittent asthma, uncomplicated: Secondary | ICD-10-CM

## 2024-01-10 DIAGNOSIS — G43009 Migraine without aura, not intractable, without status migrainosus: Secondary | ICD-10-CM

## 2024-01-10 DIAGNOSIS — R809 Proteinuria, unspecified: Secondary | ICD-10-CM

## 2024-01-10 LAB — MICROALBUMIN, URINE WAIVED
Creatinine, Urine Waived: 200 mg/dL (ref 10–300)
Microalb, Ur Waived: 80 mg/L — ABNORMAL HIGH (ref 0–19)

## 2024-01-10 MED ORDER — PROAIR HFA 108 (90 BASE) MCG/ACT IN AERS: 2.0000 | INHALATION_SPRAY | Freq: Four times a day (QID) | RESPIRATORY_TRACT | 0 refills | Status: AC | PRN

## 2024-01-10 MED ORDER — CLONAZEPAM 0.5 MG PO TABS: ORAL_TABLET | ORAL | 2 refills | Status: DC

## 2024-01-10 MED ORDER — LISINOPRIL 5 MG PO TABS: 5.0000 mg | ORAL_TABLET | Freq: Every day | ORAL | 4 refills | Status: AC

## 2024-01-10 MED ORDER — ADVAIR DISKUS 250-50 MCG/ACT IN AEPB: 1.0000 | INHALATION_SPRAY | Freq: Two times a day (BID) | RESPIRATORY_TRACT | 4 refills | Status: DC

## 2024-01-10 MED ORDER — SUMATRIPTAN 20 MG/ACT NA SOLN: 20.0000 mg | Freq: Once | NASAL | 3 refills | Status: DC

## 2024-01-10 MED ORDER — ZOLPIDEM TARTRATE 10 MG PO TABS: ORAL_TABLET | ORAL | 2 refills | Status: DC

## 2024-01-10 MED ORDER — LEVOTHYROXINE SODIUM 50 MCG PO TABS: ORAL_TABLET | ORAL | 4 refills | Status: AC

## 2024-01-10 MED ORDER — MONTELUKAST SODIUM 10 MG PO TABS: ORAL_TABLET | ORAL | 4 refills | Status: AC

## 2024-01-10 MED ORDER — ISIBLOOM 0.15-30 MG-MCG PO TABS: 1.0000 | ORAL_TABLET | Freq: Every day | ORAL | 4 refills | Status: AC

## 2024-01-10 NOTE — Assessment & Plan Note (Signed)
 Chronic, stable.  Continue current Levothyroxine dose and adjusted as needed.  Labs today.  Discussed Hashimoto's diet changes + magnesium/selenium daily as supplements.

## 2024-01-10 NOTE — Progress Notes (Signed)
 BP 108/70   Pulse 81   Temp 98 F (36.7 C) (Oral)   Ht 5\' 4"  (1.626 m)   Wt 210 lb (95.3 kg)   LMP  (LMP Unknown)   SpO2 95%   BMI 36.05 kg/m    Subjective:    Patient ID: Laurie Martinez, female    DOB: April 30, 1971, 53 y.o.   MRN: [id number]  HPI: Laurie Martinez is a 53 y.o. female presenting on 01/10/2024 for comprehensive medical examination. Current medical complaints include:none  She currently lives with: sister Menopausal Symptoms: no  HYPERTENSION/HLD Taking Lisinopril 5 MG daily. Hypertension status: controlled  Satisfied with current treatment? yes Duration of hypertension: chronic BP monitoring frequency: weekly BP range: 100/70-80 BP medication side effects:  no Medication compliance: good compliance Aspirin: no Recurrent headaches: occasional, due to migraines, improved with BCP Visual changes: no Palpitations: no Dyspnea: no Chest pain: no Lower extremity edema: no Dizzy/lightheaded: no  The 10-year ASCVD risk score (Arnett DK, et al., 2019) is: 1.3%   Values used to calculate the score:     Age: 84 years     Sex: Female     Is Non-Hispanic African American: No     Diabetic: No     Tobacco smoker: No     Systolic Blood Pressure: 108 mmHg     Is BP treated: Yes     HDL Cholesterol: 63 mg/dL     Total Cholesterol: 205 mg/dL  HYPOTHYROIDISM Taking Levothyroxine 50 MCG daily. Takes Vitamin D daily. Thyroid control status:controlled Satisfied with current treatment? yes Medication side effects: no Medication compliance: good compliance Recent dose adjustment:no Fatigue: baseline Cold intolerance: baseline Heat intolerance: no Weight gain: no Weight loss: no Constipation: yes -- takes Miralax Diarrhea/loose stools: occasional Palpitations: no Lower extremity edema: no Anxiety/depressed mood: yes, baseline   ASTHMA Taking Singulair and Advair daily and Ventolin PRN. Asthma status: controlled Satisfied with current treatment?:  yes Albuterol/rescue inhaler frequency: rarely in winter -- more spring and fall Dyspnea frequency: no Wheezing frequency: no Cough frequency: no Nocturnal symptom frequency: none Limitation of activity: none Current upper respiratory symptoms: no Triggers: seasonal allergies -- spring and fall & exercise Visits to ER or Urgent Care in past year: no Pneumovax: Up to Date Influenza: Not up to Date - refuses  DEPRESSION/ANXIETY/INSOMNIA Taking Klonopin, Zoloft, and Ambien. Last Ambien and Klonopin fill 09/08/23. Pt reports satisfaction with current treatment options and wishes to continue. Pt is aware of risks of benzo medication use to include increased sedation, respiratory suppression, falls, dependence and cardiovascular events. Pt would like to continue treatment as benefit determined to outweigh risk.   Mood status: stable Satisfied with current treatment?: yes Symptom severity: moderate  Duration of current treatment : chronic Side effects: no Medication compliance: good compliance Psychotherapy/counseling: yes in the past Depressed mood: occasional -- better than last winter Anxious mood: occasional Anhedonia: occasional Significant weight loss or gain: no Insomnia: yes hard to stay asleep Fatigue: baseline for her Feelings of worthlessness or guilt: no Impaired concentration/indecisiveness: no Suicidal ideations: no Hopelessness: no Crying spells: no    01/10/2024    8:24 AM 08/21/2023    8:25 AM 07/10/2023    8:40 AM 01/05/2023    8:16 AM 07/03/2022    8:44 AM  Depression screen PHQ 2/9  Decreased Interest 2 1 2 1 3   Down, Depressed, Hopeless 2 1 2 1 3   PHQ - 2 Score 4 2 4 2 6   Altered sleeping 2 3  3 3 3   Tired, decreased energy 2 3 3 3 3   Change in appetite 2 1 3 1  0  Feeling bad or failure about yourself  0 0 3 1 1   Trouble concentrating 3 2 2 3 2   Moving slowly or fidgety/restless 0 1 1 2  0  Suicidal thoughts 0 0 0 0 0  PHQ-9 Score 13 12 19 15 15   Difficult  doing work/chores Somewhat difficult Somewhat difficult Somewhat difficult         01/10/2024    8:24 AM 08/21/2023    8:25 AM 07/10/2023    8:40 AM 01/05/2023    8:16 AM  GAD 7 : Generalized Anxiety Score  Nervous, Anxious, on Edge 1 2 2 2   Control/stop worrying 0 1 2 1   Worry too much - different things 1 1 2 2   Trouble relaxing 0 1 1 1   Restless 1 1 1 1   Easily annoyed or irritable 2 1 1 1   Afraid - awful might happen 0 0 0 0  Total GAD 7 Score 5 7 9 8   Anxiety Difficulty Somewhat difficult Somewhat difficult Somewhat difficult       07/03/2022    8:43 AM 01/05/2023    8:16 AM 01/05/2023    8:27 AM 08/21/2023    8:24 AM 01/10/2024    8:23 AM  Fall Risk  Falls in the past year? 0 0 0 0 0  Was there an injury with Fall? 0 0 0 0 0  Fall Risk Category Calculator 0 0 0 0 0  Fall Risk Category (Retired) Low      (RETIRED) Patient Fall Risk Level Low fall risk      Patient at Risk for Falls Due to No Fall Risks No Fall Risks No Fall Risks No Fall Risks No Fall Risks  Fall risk Follow up Falls evaluation completed Falls evaluation completed Falls prevention discussed Falls evaluation completed Falls evaluation completed    Past Medical History:  Past Medical History:  Diagnosis Date   Asthma    Constipation    Depression    Hypertension    Insomnia     Surgical History:  History reviewed. No pertinent surgical history.  Medications:  Current Outpatient Medications on File Prior to Visit  Medication Sig   hydrocortisone (ANUSOL-HC) 2.5 % rectal cream Place 1 application rectally 2 (two) times daily.   sertraline (ZOLOFT) 25 MG tablet Take 25 MG (1 tablet) daily by mouth for mood and may increase to 50 MG (2 tablets) by mouth in 2 weeks if needed.   No current facility-administered medications on file prior to visit.    Allergies:  No Known Allergies  Social History:  Social History   Socioeconomic History   Marital status: Single    Spouse name: Not on file   Number of  children: Not on file   Years of education: Not on file   Highest education level: Not on file  Occupational History   Not on file  Tobacco Use   Smoking status: Never   Smokeless tobacco: Never  Vaping Use   Vaping status: Never Used  Substance and Sexual Activity   Alcohol use: No    Alcohol/week: 0.0 standard drinks of alcohol   Drug use: No   Sexual activity: Never  Other Topics Concern   Not on file  Social History Narrative   Not on file   Social Drivers of Health   Financial Resource Strain: Low Risk  (01/10/2024)  Overall Financial Resource Strain (CARDIA)    Difficulty of Paying Living Expenses: Not hard at all  Food Insecurity: No Food Insecurity (01/10/2024)   Hunger Vital Sign    Worried About Running Out of Food in the Last Year: Never true    Ran Out of Food in the Last Year: Never true  Transportation Needs: No Transportation Needs (01/10/2024)   PRAPARE - Administrator, Civil Service (Medical): No    Lack of Transportation (Non-Medical): No  Physical Activity: Inactive (01/10/2024)   Exercise Vital Sign    Days of Exercise per Week: 0 days    Minutes of Exercise per Session: 0 min  Stress: Stress Concern Present (01/10/2024)   [employer] of Occupational Health - Occupational Stress Questionnaire    Feeling of Stress : Rather much  Social Connections: Socially Isolated (01/10/2024)   Social Connection and Isolation Panel [NHANES]    Frequency of Communication with Friends and Family: More than three times a week    Frequency of Social Gatherings with Friends and Family: Never    Attends Religious Services: Never    Database administrator or Organizations: No    Attends Banker Meetings: Never    Marital Status: Never married  Intimate Partner Violence: Not At Risk (01/10/2024)   Humiliation, Afraid, Rape, and Kick questionnaire    Fear of Current or Ex-Partner: No    Emotionally Abused: No    Physically Abused: No    Sexually  Abused: No   Social History   Tobacco Use  Smoking Status Never  Smokeless Tobacco Never   Social History   Substance and Sexual Activity  Alcohol Use No   Alcohol/week: 0.0 standard drinks of alcohol    Family History:  Family History  Problem Relation Age of Onset   Depression Mother    Migraines Sister    Breast cancer Maternal Grandmother     Past medical history, surgical history, medications, allergies, family history and social history reviewed with patient today and changes made to appropriate areas of the chart.   ROS All other ROS negative except what is listed above and in the HPI.      Objective:    BP 108/70   Pulse 81   Temp 98 F (36.7 C) (Oral)   Ht 5\' 4"  (1.626 m)   Wt 210 lb (95.3 kg)   LMP  (LMP Unknown)   SpO2 95%   BMI 36.05 kg/m   Wt Readings from Last 3 Encounters:  01/10/24 210 lb (95.3 kg)  08/21/23 206 lb 3.2 oz (93.5 kg)  07/10/23 203 lb 12.8 oz (92.4 kg)    Physical Exam Vitals and nursing note reviewed. Exam conducted with a chaperone present.  Constitutional:      General: She is awake. She is not in acute distress.    Appearance: She is well-developed and well-groomed. She is obese. She is not ill-appearing or toxic-appearing.  HENT:     Head: Normocephalic and atraumatic.     Right Ear: Hearing, tympanic membrane, ear canal and external ear normal. No drainage.     Left Ear: Hearing, tympanic membrane, ear canal and external ear normal. No drainage.     Nose: Nose normal.     Right Sinus: No maxillary sinus tenderness or frontal sinus tenderness.     Left Sinus: No maxillary sinus tenderness or frontal sinus tenderness.     Mouth/Throat:     Mouth: Mucous membranes are moist.  Pharynx: Oropharynx is clear. Uvula midline. No pharyngeal swelling, oropharyngeal exudate or posterior oropharyngeal erythema.  Eyes:     General: Lids are normal.        Right eye: No discharge.        Left eye: No discharge.     Extraocular  Movements: Extraocular movements intact.     Conjunctiva/sclera: Conjunctivae normal.     Pupils: Pupils are equal, round, and reactive to light.     Visual Fields: Right eye visual fields normal and left eye visual fields normal.  Neck:     Thyroid: No thyromegaly.     Vascular: No carotid bruit.     Trachea: Trachea normal.  Cardiovascular:     Rate and Rhythm: Normal rate and regular rhythm.     Heart sounds: Normal heart sounds. No murmur heard.    No gallop.  Pulmonary:     Effort: Pulmonary effort is normal. No accessory muscle usage or respiratory distress.     Breath sounds: Normal breath sounds.  Chest:  Breasts:    Right: Normal.     Left: Normal.  Abdominal:     General: Bowel sounds are normal.     Palpations: Abdomen is soft. There is no hepatomegaly or splenomegaly.     Tenderness: There is no abdominal tenderness.  Musculoskeletal:        General: Normal range of motion.     Cervical back: Normal range of motion and neck supple.     Right lower leg: No edema.     Left lower leg: No edema.  Lymphadenopathy:     Head:     Right side of head: No submental, submandibular, tonsillar, preauricular or posterior auricular adenopathy.     Left side of head: No submental, submandibular, tonsillar, preauricular or posterior auricular adenopathy.     Cervical: No cervical adenopathy.     Upper Body:     Right upper body: No supraclavicular, axillary or pectoral adenopathy.     Left upper body: No supraclavicular, axillary or pectoral adenopathy.  Skin:    General: Skin is warm and dry.     Capillary Refill: Capillary refill takes less than 2 seconds.     Findings: No rash.  Neurological:     Mental Status: She is alert and oriented to person, place, and time.     Gait: Gait is intact.     Deep Tendon Reflexes: Reflexes are normal and symmetric.     Reflex Scores:      Brachioradialis reflexes are 2+ on the right side and 2+ on the left side.      Patellar reflexes are  2+ on the right side and 2+ on the left side. Psychiatric:        Attention and Perception: Attention normal.        Mood and Affect: Mood normal.        Speech: Speech normal.        Behavior: Behavior normal. Behavior is cooperative.        Thought Content: Thought content normal.        Judgment: Judgment normal.    Results for orders placed or performed in visit on 01/05/23  Microalbumin, Urine Waived   Collection Time: 01/05/23  8:40 AM  Result Value Ref Range   Microalb, Ur Waived 30 (H) 0 - 19 mg/L   Creatinine, Urine Waived 10 10 - 300 mg/dL   Microalb/Creat Ratio 30-300 (H) <30 mg/g  [id number] 11+Oxyco+Alc+Crt-Bund  Collection Time: 01/05/23  8:40 AM  Result Value Ref Range   Ethanol Negative Cutoff=0.020 %   Amphetamines, Urine Negative Cutoff=1000 ng/mL   Barbiturate Negative Cutoff=200 ng/mL   BENZODIAZ UR QL Negative Cutoff=200 ng/mL   Cannabinoid Quant, Ur Negative Cutoff=50 ng/mL   Cocaine (Metabolite) Negative Cutoff=300 ng/mL   OPIATE SCREEN URINE Negative Cutoff=300 ng/mL   Oxycodone/Oxymorphone, Urine Negative Cutoff=300 ng/mL   Phencyclidine Negative Cutoff=25 ng/mL   Methadone Screen, Urine Negative Cutoff=300 ng/mL   Propoxyphene Negative Cutoff=300 ng/mL   Meperidine Negative Cutoff=200 ng/mL   Tramadol Negative Cutoff=200 ng/mL   Creatinine 44.7 20.0 - 300.0 mg/dL   pH, Urine 6.3 4.5 - 8.9  CBC with Differential/Platelet   Collection Time: 01/05/23  8:43 AM  Result Value Ref Range   WBC 7.8 3.4 - 10.8 x10E3/uL   RBC 4.26 3.77 - 5.28 x10E6/uL   Hemoglobin 13.9 11.1 - 15.9 g/dL   Hematocrit 40.9 81.1 - 46.6 %   MCV 94 79 - 97 fL   MCH 32.6 26.6 - 33.0 pg   MCHC 34.7 31.5 - 35.7 g/dL   RDW 91.4 (L) 78.2 - 95.6 %   Platelets 298 150 - 450 x10E3/uL   Neutrophils 58 Not Estab. %   Lymphs 34 Not Estab. %   Monocytes 5 Not Estab. %   Eos 2 Not Estab. %   Basos 1 Not Estab. %   Neutrophils Absolute 4.6 1.4 - 7.0 x10E3/uL   Lymphocytes Absolute 2.6 0.7  - 3.1 x10E3/uL   Monocytes Absolute 0.4 0.1 - 0.9 x10E3/uL   EOS (ABSOLUTE) 0.2 0.0 - 0.4 x10E3/uL   Basophils Absolute 0.0 0.0 - 0.2 x10E3/uL   Immature Granulocytes 0 Not Estab. %   Immature Grans (Abs) 0.0 0.0 - 0.1 x10E3/uL  Comprehensive metabolic panel   Collection Time: 01/05/23  8:43 AM  Result Value Ref Range   Glucose 92 70 - 99 mg/dL   BUN 10 6 - 24 mg/dL   Creatinine, Ser 2.13 0.57 - 1.00 mg/dL   eGFR 83 >08 MV/HQI/6.96   BUN/Creatinine Ratio 12 9 - 23   Sodium 138 134 - 144 mmol/L   Potassium 4.4 3.5 - 5.2 mmol/L   Chloride 102 96 - 106 mmol/L   CO2 19 (L) 20 - 29 mmol/L   Calcium 9.3 8.7 - 10.2 mg/dL   Total Protein 7.2 6.0 - 8.5 g/dL   Albumin 4.5 3.8 - 4.9 g/dL   Globulin, Total 2.7 1.5 - 4.5 g/dL   Albumin/Globulin Ratio 1.7 1.2 - 2.2   Bilirubin Total <0.2 0.0 - 1.2 mg/dL   Alkaline Phosphatase 86 44 - 121 IU/L   AST 12 0 - 40 IU/L   ALT 7 0 - 32 IU/L  Lipid Panel w/o Chol/HDL Ratio   Collection Time: 01/05/23  8:43 AM  Result Value Ref Range   Cholesterol, Total 205 (H) 100 - 199 mg/dL   Triglycerides 295 0 - 149 mg/dL   HDL 63 >28 mg/dL   VLDL Cholesterol Cal 19 5 - 40 mg/dL   LDL Chol Calc (NIH) 413 (H) 0 - 99 mg/dL  TSH   Collection Time: 01/05/23  8:43 AM  Result Value Ref Range   TSH 2.070 0.450 - 4.500 uIU/mL  T4, free   Collection Time: 01/05/23  8:43 AM  Result Value Ref Range   Free T4 1.13 0.82 - 1.77 ng/dL  VITAMIN D 25 Hydroxy (Vit-D Deficiency, Fractures)   Collection Time: 01/05/23  8:43 AM  Result Value Ref Range   Vit D, 25-Hydroxy 79.4 30.0 - 100.0 ng/mL      Assessment & Plan:   Problem List Items Addressed This Visit       Cardiovascular and Mediastinum   Hypertension   Chronic, stable.  BP at goal in office today and at goal on home checks.  Continue current medication regimen and adjust as needed.  Recommend she monitor BP at least a few mornings a week at home and document.  DASH diet at home.  Refills sent.  Labs today:  CBC, CMP, TSH, urine alb.       Relevant Medications   lisinopril (ZESTRIL) 5 MG tablet   Other Relevant Orders   CBC with Differential/Platelet   Comprehensive metabolic panel   Microalbumin, Urine Waived   Migraines   Chronic, stable. Continue desogestrel-ethinyl estradiol continuous for hormonal maintenance. Continue use of sumatriptan as prescribed PRN for migraine headaches. Discussed the option of continuing continuous birth control up to age 89 for migraine prevention or allowing a one week break every month for menstrual cycle. Pt opted for continual birth control use at this time. Educated on the importance of annual mammograms with this option. Pt to notify provider with any changes in headaches, frequency, or severity.       Relevant Medications   clonazePAM (KLONOPIN) 0.5 MG tablet   lisinopril (ZESTRIL) 5 MG tablet   SUMAtriptan (IMITREX) 20 MG/ACT nasal spray     Respiratory   Asthma   Chronic, stable. To continue current medication regimen as prescribed, as has offered benefit for years. To notify provider with change in symptoms or increased use/need of rescue inhaler.       Relevant Medications   ADVAIR DISKUS 250-50 MCG/ACT AEPB   montelukast (SINGULAIR) 10 MG tablet   PROAIR HFA 108 (90 Base) MCG/ACT inhaler     Endocrine   Hashimoto's thyroiditis   Chronic, stable.  Continue current Levothyroxine dose and adjusted as needed.  Labs today.  Discussed Hashimoto's diet changes + magnesium/selenium daily as supplements.       Relevant Medications   levothyroxine (SYNTHROID) 50 MCG tablet   Other Relevant Orders   T4, free   TSH     Other   Depression - Primary   Chronic, stable -- improved winter symptoms with Zoloft.  Denies SI/HI. Continue PRN clonazepam as needed for acute events -- she uses more often in fall and winter.  Continue Zoloft 25 MG daily as is offering benefit (she tried 50 MG and this was too much).  She can slowly wean off when season changes  back to spring (March).  Educated her on this and BLACK BOX warning.  Recommend use of light therapy and showed her different things to use for this.  Obtain UDS 01/09/25, contract up to date.       Relevant Orders   [id number] 11+Oxyco+Alc+Crt-Bund   Elevated LDL cholesterol level   Continue diet focus at this time and recheck lipid panel today -- fasting.  ASCVD 1.3%.      Relevant Orders   Comprehensive metabolic panel   Lipid Panel w/o Chol/HDL Ratio   Generalized anxiety disorder   Refer to depression plan of care.  Goal is minimal Klonopin use.      Insomnia   Chronic, stable with intermittent Ambien use.  Discussed sleep hygiene methods that may benefit her. Contract up to date for controlled substance and UDS next 01/09/25.  Refills sent.  Long-term current use of benzodiazepine   Obtain UDS next 01/09/25.  Discussed at length risk of long term benzo use, at this time she uses appropriately, minimally.  Recommend return to minimal use.      Relevant Orders   [id number] 11+Oxyco+Alc+Crt-Bund   Obesity   BMI 36.05.  Recommended eating smaller high protein, low fat meals more frequently and exercising 30 mins a day 5 times a week with a goal of 10-15lb weight loss in the next 3 months. Patient voiced their understanding and motivation to adhere to these recommendations.       Proteinuria   With HTN -- urine ALB 30 last check -- recheck today.  Continue Lisinopril for kidney protection and check CMP.        Vitamin D deficiency   Ongoing.  Reports history of low level, check today and continue daily supplement, adjust as needed.      Relevant Orders   VITAMIN D 25 Hydroxy (Vit-D Deficiency, Fractures)   Other Visit Diagnoses       Encounter for annual physical exam       Annual physical today, health maintenance reviewed with patient.        Follow up plan: Return in about 6 months (around 07/12/2024) for DEPRESSION/ANXIETY, HTN.   LABORATORY TESTING:  - Pap smear:  refuses  IMMUNIZATIONS:   - Tdap: Tetanus vaccination status reviewed: last tetanus booster within 10 years. - Influenza: Refused - Pneumovax: Refused - Prevnar: Up to date - COVID: Up to date x 3 - HPV: Not applicable - Shingrix vaccine: Refused  SCREENING: -Mammogram: Up to date -- August 29, 2024 next - Colonoscopy: Refused  - Bone Density: Not applicable  -Hearing Test: Not applicable  -Spirometry: Not applicable   PATIENT COUNSELING:   Advised to take 1 mg of folate supplement per day if capable of pregnancy.   Sexuality: Discussed sexually transmitted diseases, partner selection, use of condoms, avoidance of unintended pregnancy  and contraceptive alternatives.   Advised to avoid cigarette smoking.  I discussed with the patient that most people either abstain from alcohol or drink within safe limits (<=14/week and <=4 drinks/occasion for males, <=7/weeks and <= 3 drinks/occasion for females) and that the risk for alcohol disorders and other health effects rises proportionally with the number of drinks per week and how often a drinker exceeds daily limits.  Discussed cessation/primary prevention of drug use and availability of treatment for abuse.   Diet: Encouraged to adjust caloric intake to maintain  or achieve ideal body weight, to reduce intake of dietary saturated fat and total fat, to limit sodium intake by avoiding high sodium foods and not adding table salt, and to maintain adequate dietary potassium and calcium preferably from fresh fruits, vegetables, and low-fat dairy products.    Stressed the importance of regular exercise  Injury prevention: Discussed safety belts, safety helmets, smoke detector, smoking near bedding or upholstery.   Dental health: Discussed importance of regular tooth brushing, flossing, and dental visits.    NEXT PREVENTATIVE PHYSICAL DUE IN 1 YEAR. Return in about 6 months (around 07/12/2024) for DEPRESSION/ANXIETY, HTN.

## 2024-01-10 NOTE — Assessment & Plan Note (Signed)
 With HTN -- urine ALB 30 last check -- recheck today.  Continue Lisinopril for kidney protection and check CMP.

## 2024-01-10 NOTE — Assessment & Plan Note (Signed)
 Chronic, stable.  BP at goal in office today and at goal on home checks.  Continue current medication regimen and adjust as needed.  Recommend she monitor BP at least a few mornings a week at home and document.  DASH diet at home.  Refills sent.  Labs today: CBC, CMP, TSH, urine alb.

## 2024-01-10 NOTE — Assessment & Plan Note (Signed)
 Obtain UDS next 01/09/25.  Discussed at length risk of long term benzo use, at this time she uses appropriately, minimally.  Recommend return to minimal use.

## 2024-01-10 NOTE — Assessment & Plan Note (Signed)
BMI 36.05.  Recommended eating smaller high protein, low fat meals more frequently and exercising 30 mins a day 5 times a week with a goal of 10-15lb weight loss in the next 3 months. Patient voiced their understanding and motivation to adhere to these recommendations. ? ?

## 2024-01-10 NOTE — Assessment & Plan Note (Addendum)
 Refer to depression plan of care.  Goal is minimal Klonopin use.

## 2024-01-10 NOTE — Assessment & Plan Note (Signed)
Continue diet focus at this time and recheck lipid panel today -- fasting.  ASCVD 1.3%. 

## 2024-01-10 NOTE — Assessment & Plan Note (Signed)
 Chronic, stable -- improved winter symptoms with Zoloft.  Denies SI/HI. Continue PRN clonazepam as needed for acute events -- she uses more often in fall and winter.  Continue Zoloft 25 MG daily as is offering benefit (she tried 50 MG and this was too much).  She can slowly wean off when season changes back to spring (March).  Educated her on this and BLACK BOX warning.  Recommend use of light therapy and showed her different things to use for this.  Obtain UDS 01/09/25, contract up to date.

## 2024-01-10 NOTE — Assessment & Plan Note (Signed)
Chronic, stable. Continue desogestrel-ethinyl estradiol continuous for hormonal maintenance. Continue use of sumatriptan as prescribed PRN for migraine headaches. Discussed the option of continuing continuous birth control up to age 52 for migraine prevention or allowing a one week break every month for menstrual cycle. Pt opted for continual birth control use at this time. Educated on the importance of annual mammograms with this option. Pt to notify provider with any changes in headaches, frequency, or severity.  

## 2024-01-10 NOTE — Telephone Encounter (Signed)
 Copied from CRM [id number]. Topic: Clinical - Medication Question >> Jan 10, 2024 12:01 PM Yolanda T wrote: Reason for CRM: Angelica Chessman from [facility] called stated insurance will not cover ADVAIR DISKUS 250-50 MCG/ACT AEPB. Angelica Chessman is requesting an alternate medication however insurance did not say what they would cover

## 2024-01-10 NOTE — Assessment & Plan Note (Signed)
 Chronic, stable with intermittent Ambien use.  Discussed sleep hygiene methods that may benefit her. Contract up to date for controlled substance and UDS next 01/09/25.  Refills sent.

## 2024-01-10 NOTE — Assessment & Plan Note (Signed)
 Ongoing.  Reports history of low level, check today and continue daily supplement, adjust as needed.

## 2024-01-10 NOTE — Assessment & Plan Note (Signed)
 Chronic, stable. To continue current medication regimen as prescribed, as has offered benefit for years. To notify provider with change in symptoms or increased use/need of rescue inhaler.

## 2024-01-11 ENCOUNTER — Other Ambulatory Visit: Payer: Self-pay

## 2024-01-11 LAB — CBC WITH DIFFERENTIAL/PLATELET
Basophils Absolute: 0.1 10*3/uL (ref 0.0–0.2)
Basos: 1 %
EOS (ABSOLUTE): 0.2 10*3/uL (ref 0.0–0.4)
Eos: 2 %
Hematocrit: 40.8 % (ref 34.0–46.6)
Hemoglobin: 14 g/dL (ref 11.1–15.9)
Immature Grans (Abs): 0 10*3/uL (ref 0.0–0.1)
Immature Granulocytes: 0 %
Lymphocytes Absolute: 2.7 10*3/uL (ref 0.7–3.1)
Lymphs: 30 %
MCH: 32.9 pg (ref 26.6–33.0)
MCHC: 34.3 g/dL (ref 31.5–35.7)
MCV: 96 fL (ref 79–97)
Monocytes Absolute: 0.5 10*3/uL (ref 0.1–0.9)
Monocytes: 6 %
Neutrophils Absolute: 5.4 10*3/uL (ref 1.4–7.0)
Neutrophils: 61 %
Platelets: 321 10*3/uL (ref 150–450)
RBC: 4.25 x10E6/uL (ref 3.77–5.28)
RDW: 11.7 % (ref 11.7–15.4)
WBC: 8.8 10*3/uL (ref 3.4–10.8)

## 2024-01-11 LAB — COMPREHENSIVE METABOLIC PANEL
ALT: 14 IU/L (ref 0–32)
AST: 24 IU/L (ref 0–40)
Albumin: 3.9 g/dL (ref 3.8–4.9)
Alkaline Phosphatase: 87 IU/L (ref 44–121)
BUN/Creatinine Ratio: 15 (ref 9–23)
BUN: 12 mg/dL (ref 6–24)
Bilirubin Total: <0.2 mg/dL (ref 0.0–1.2)
CO2: 19 mmol/L — ABNORMAL LOW (ref 20–29)
Calcium: 8.9 mg/dL (ref 8.7–10.2)
Chloride: 100 mmol/L (ref 96–106)
Creatinine, Ser: 0.79 mg/dL (ref 0.57–1.00)
Globulin, Total: 2.8 g/dL (ref 1.5–4.5)
Glucose: 87 mg/dL (ref 70–99)
Potassium: 4.7 mmol/L (ref 3.5–5.2)
Sodium: 138 mmol/L (ref 134–144)
Total Protein: 6.7 g/dL (ref 6.0–8.5)
eGFR: 90 mL/min/{1.73_m2} (ref >59)

## 2024-01-11 LAB — LIPID PANEL W/O CHOL/HDL RATIO
Cholesterol, Total: 214 mg/dL — ABNORMAL HIGH (ref 100–199)
HDL: 55 mg/dL (ref >39)
LDL Chol Calc (NIH): 132 mg/dL — ABNORMAL HIGH (ref 0–99)
Triglycerides: 152 mg/dL — ABNORMAL HIGH (ref 0–149)
VLDL Cholesterol Cal: 27 mg/dL (ref 5–40)

## 2024-01-11 LAB — T4, FREE: Free T4: 1.03 ng/dL (ref 0.82–1.77)

## 2024-01-11 LAB — TSH: TSH: 2 u[IU]/mL (ref 0.450–4.500)

## 2024-01-11 LAB — VITAMIN D 25 HYDROXY (VIT D DEFICIENCY, FRACTURES): Vit D, 25-Hydroxy: 82.6 ng/mL (ref 30.0–100.0)

## 2024-01-11 MED ORDER — BUDESONIDE-FORMOTEROL FUMARATE 160-4.5 MCG/ACT IN AERO: 2.0000 | INHALATION_SPRAY | Freq: Two times a day (BID) | RESPIRATORY_TRACT | 5 refills | Status: DC

## 2024-01-11 MED ORDER — BUDESONIDE-FORMOTEROL FUMARATE 160-4.5 MCG/ACT IN AERO: 2.0000 | INHALATION_SPRAY | Freq: Two times a day (BID) | RESPIRATORY_TRACT | 5 refills | Status: AC

## 2024-01-11 NOTE — Progress Notes (Signed)
 Good morning [name], your labs have returned: - Kidney function, creatinine and eGFR, remains normal, as is liver function, AST and ALT.  - Thyroid labs stable, no Levothyroxine changes needed. - CBC and Vitamin D stable. - Lipid panel continues to show elevations, but no medications needed at this time.  Continue focus on healthy diet choices and regular exercise.  Any questions? Keep being amazing!!  Thank you for allowing me to participate in your care.  I appreciate you. Kindest regards, Daniqua Campoy

## 2024-01-11 NOTE — Telephone Encounter (Signed)
 RX t'd up for provider to send in.   Copied from CRM [id number]. Topic: Clinical - Prescription Issue >> Jan 11, 2024  2:18 PM Dondra Prader E wrote: Reason for CRM: Mandy from [facility] Symbicort quantity 1 needs to be sent quantity 10.2 becuase of stock size bottle

## 2024-01-11 NOTE — Addendum Note (Signed)
 Addended by: Aura Dials T on: 01/11/2024 01:30 PM   Modules accepted: Orders

## 2024-01-11 NOTE — Telephone Encounter (Signed)
Called and notified patient of medication change.  

## 2024-01-11 NOTE — Telephone Encounter (Signed)
 Routing to provider. Received fax from cover my meds. The fax states that budesonide may be covered, Symbicort. Can we send this in for the patient instead?

## 2024-01-12 LAB — DRUG SCREEN 764883 11+OXYCO+ALC+CRT-BUND
Amphetamines, Urine: NEGATIVE ng/mL
BENZODIAZ UR QL: NEGATIVE ng/mL
Barbiturate: NEGATIVE ng/mL
Cannabinoid Quant, Ur: NEGATIVE ng/mL
Cocaine (Metabolite): NEGATIVE ng/mL
Creatinine: 126.3 mg/dL (ref 20.0–300.0)
Ethanol: NEGATIVE %
Meperidine: NEGATIVE ng/mL
Methadone Screen, Urine: NEGATIVE ng/mL
OPIATE SCREEN URINE: NEGATIVE ng/mL
Oxycodone/Oxymorphone, Urine: NEGATIVE ng/mL
Phencyclidine: NEGATIVE ng/mL
Propoxyphene: NEGATIVE ng/mL
Tramadol: NEGATIVE ng/mL
pH, Urine: 6.1 (ref 4.5–8.9)

## 2024-01-16 ENCOUNTER — Encounter: Payer: Managed Care, Other (non HMO) | Admitting: Nurse Practitioner

## 2024-07-19 NOTE — Patient Instructions (Signed)
 Be Involved in Caring For Your Health:  Taking Medications When medications are taken as directed, they can greatly improve your health. But if they are not taken as prescribed, they may not work. In some cases, not taking them correctly can be harmful. To help ensure your treatment remains effective and safe, understand your medications and how to take them. Bring your medications to each visit for review by your provider.  Your lab results, notes, and after visit summary will be available on My Chart. We strongly encourage you to use this feature. If lab results are abnormal the clinic will contact you with the appropriate steps. If the clinic does not contact you assume the results are satisfactory. You can always view your results on My Chart. If you have questions regarding your health or results, please contact the clinic during office hours. You can also ask questions on My Chart.  We at [facility] are grateful that you chose [place] to provide your care. We strive to provide evidence-based and compassionate care and are always looking for feedback. If you get a survey from the clinic please complete this so we can hear your opinions.  Managing Anxiety, Adult After being diagnosed with anxiety, you may be relieved to know why you have felt or behaved a certain way. You may also feel overwhelmed about the treatment ahead and what it will mean for your life. With care and support, you can manage your anxiety. How to manage lifestyle changes Understanding the difference between stress and anxiety Although stress can play a role in anxiety, it is not the same as anxiety. Stress is your body's reaction to life changes and events, both good and bad. Stress is often caused by something external, such as a deadline, test, or competition. It normally goes away after the event has ended and will last just a few hours. But, stress can be ongoing and can lead to more than just stress. Anxiety is  caused by something internal, such as imagining a terrible outcome or worrying that something will go wrong that will greatly upset you. Anxiety often does not go away even after the event is over, and it can become a long-term (chronic) worry. Lowering stress and anxiety Talk with your health care provider or a counselor to learn more about lowering anxiety and stress. They may suggest tension-reduction techniques, such as: Music. Spend time creating or listening to music that you enjoy and that inspires you. Mindfulness-based meditation. Practice being aware of your normal breaths while not trying to control your breathing. It can be done while sitting or walking. Centering prayer. Focus on a word, phrase, or sacred image that means something to you and brings you peace. Deep breathing. Expand your stomach and inhale slowly through your nose. Hold your breath for 3-5 seconds. Then breathe out slowly, letting your stomach muscles relax. Self-talk. Learn to notice and spot thought patterns that lead to anxiety reactions. Change those patterns to thoughts that feel peaceful. Muscle relaxation. Take time to tense muscles and then relax them. Choose a tension-reduction technique that fits your lifestyle and personality. These techniques take time and practice. Set aside 5-15 minutes a day to do them. Specialized therapists can offer counseling and training in these techniques. The training to help with anxiety may be covered by some insurance plans. Other things you can do to manage stress and anxiety include: Keeping a stress diary. This can help you learn what triggers your reaction and then learn ways  to manage your response. Thinking about how you react to certain situations. You may not be able to control everything, but you can control your response. Making time for activities that help you relax and not feeling guilty about spending your time in this way. Doing visual imagery. This involves  imagining or creating mental pictures to help you relax. Practicing yoga. Through yoga poses, you can lower tension and relax.  Medicines Medicines for anxiety include: Antidepressant medicines. These are usually prescribed for long-term daily control. Anti-anxiety medicines. These may be added in severe cases, especially when panic attacks occur. When used together, medicines, psychotherapy, and tension-reduction techniques may be the most effective treatment. Relationships Relationships can play a big part in helping you recover. Spend more time connecting with trusted friends and family members. Think about going to couples counseling if you have a partner, taking family education classes, or going to family therapy. Therapy can help you and others better understand your anxiety. How to recognize changes in your anxiety Everyone responds differently to treatment for anxiety. Recovery from anxiety happens when symptoms lessen and stop interfering with your daily life at home or work. This may mean that you will start to: Have better concentration and focus. Worry will interfere less in your daily thinking. Sleep better. Be less irritable. Have more energy. Have improved memory. Try to recognize when your condition is getting worse. Contact your provider if your symptoms interfere with home or work and you feel like your condition is not improving. Follow these instructions at home: Activity Exercise. Adults should: Exercise for at least 150 minutes each week. The exercise should increase your heart rate and make you sweat (moderate-intensity exercise). Do strengthening exercises at least twice a week. Get the right amount and quality of sleep. Most adults need 7-9 hours of sleep each night. Lifestyle  Eat a healthy diet that includes plenty of vegetables, fruits, whole grains, low-fat dairy products, and lean protein. Do not eat a lot of foods that are high in fats, added sugars, or salt  (sodium). Make choices that simplify your life. Do not use any products that contain nicotine or tobacco. These products include cigarettes, chewing tobacco, and vaping devices, such as e-cigarettes. If you need help quitting, ask your provider. Avoid caffeine, alcohol, and certain over-the-counter cold medicines. These may make you feel worse. Ask your pharmacist which medicines to avoid. General instructions Take over-the-counter and prescription medicines only as told by your provider. Keep all follow-up visits. This is to make sure you are managing your anxiety well or if you need more support. Where to find support You can get help and support from: Self-help groups. Online and [employer]. A trusted spiritual leader. Couples counseling. Family education classes. Family therapy. Where to find more information You may find that joining a support group helps you deal with your anxiety. The following sources can help you find counselors or support groups near you: Mental Health [place]: mentalhealthamerica.net Anxiety and Depression Association of [place] (ADAA): adaa.org The [employer] Mental Illness (NAMI): nami.org Contact a health care provider if: You have a hard time staying focused or finishing tasks. You spend many hours a day feeling worried about everyday life. You are very tired because you cannot stop worrying. You start to have headaches or often feel tense. You have chronic nausea or diarrhea. Get help right away if: Your heart feels like it is racing. You have shortness of breath. You have thoughts of hurting yourself or others. Get help  right away if you feel like you may hurt yourself or others, or have thoughts about taking your own life. Go to your nearest emergency room or: Call 911. Call the National Suicide Prevention Lifeline at 765-482-1593 or 988. This is open 24 hours a day. Text the Crisis Text Line at 504 124 9896. This information is not  intended to replace advice given to you by your health care provider. Make sure you discuss any questions you have with your health care provider. Document Revised: 08/01/2022 Document Reviewed: 02/13/2021 Elsevier Patient Education  2024 [employer].

## 2024-07-22 ENCOUNTER — Ambulatory Visit: Admitting: Nurse Practitioner

## 2024-07-22 ENCOUNTER — Encounter: Payer: Self-pay | Admitting: Nurse Practitioner

## 2024-07-22 VITALS — BP 128/82 | HR 82 | Temp 98.6°F | Resp 15 | Ht 64.02 in | Wt 205.6 lb

## 2024-07-22 DIAGNOSIS — E7800 Pure hypercholesterolemia, unspecified: Secondary | ICD-10-CM | POA: Diagnosis not present

## 2024-07-22 DIAGNOSIS — I10 Essential (primary) hypertension: Secondary | ICD-10-CM | POA: Diagnosis not present

## 2024-07-22 DIAGNOSIS — F411 Generalized anxiety disorder: Secondary | ICD-10-CM

## 2024-07-22 DIAGNOSIS — F331 Major depressive disorder, recurrent, moderate: Secondary | ICD-10-CM

## 2024-07-22 DIAGNOSIS — E063 Autoimmune thyroiditis: Secondary | ICD-10-CM

## 2024-07-22 DIAGNOSIS — E66811 Obesity, class 1: Secondary | ICD-10-CM

## 2024-07-22 DIAGNOSIS — Z79899 Other long term (current) drug therapy: Secondary | ICD-10-CM

## 2024-07-22 DIAGNOSIS — J4520 Mild intermittent asthma, uncomplicated: Secondary | ICD-10-CM

## 2024-07-22 DIAGNOSIS — G43009 Migraine without aura, not intractable, without status migrainosus: Secondary | ICD-10-CM

## 2024-07-22 DIAGNOSIS — F5101 Primary insomnia: Secondary | ICD-10-CM

## 2024-07-22 DIAGNOSIS — K219 Gastro-esophageal reflux disease without esophagitis: Secondary | ICD-10-CM

## 2024-07-22 DIAGNOSIS — E6609 Other obesity due to excess calories: Secondary | ICD-10-CM

## 2024-07-22 MED ORDER — SUMATRIPTAN 20 MG/ACT NA SOLN: 20.0000 mg | NASAL | 3 refills | Status: AC | PRN

## 2024-07-22 MED ORDER — ZOLPIDEM TARTRATE 10 MG PO TABS: ORAL_TABLET | ORAL | 2 refills | Status: AC

## 2024-07-22 MED ORDER — ONDANSETRON 4 MG PO TBDP: 4.0000 mg | ORAL_TABLET | Freq: Three times a day (TID) | ORAL | 0 refills | Status: AC | PRN

## 2024-07-22 MED ORDER — SERTRALINE HCL 25 MG PO TABS: ORAL_TABLET | ORAL | 4 refills | Status: AC

## 2024-07-22 MED ORDER — PANTOPRAZOLE SODIUM 20 MG PO TBEC: 20.0000 mg | DELAYED_RELEASE_TABLET | Freq: Every day | ORAL | 2 refills | Status: AC

## 2024-07-22 MED ORDER — CLONAZEPAM 0.5 MG PO TABS: ORAL_TABLET | ORAL | 2 refills | Status: AC

## 2024-07-22 NOTE — Assessment & Plan Note (Signed)
 Chronic, stable. To continue current medication regimen as prescribed, as has offered benefit for years. To notify provider with change in symptoms or increased use/need of rescue inhaler.

## 2024-07-22 NOTE — Assessment & Plan Note (Signed)
 BMI 35.27 with 5 lbs lost since last visit, praised for this.  Recommended eating smaller high protein, low fat meals more frequently and exercising 30 mins a day 5 times a week with a goal of 10-15lb weight loss in the next 3 months. Patient voiced their understanding and motivation to adhere to these recommendations.

## 2024-07-22 NOTE — Assessment & Plan Note (Signed)
 Continue diet focus at this time and recheck lipid panel annually, at physical.  The 10-year ASCVD risk score (Arnett DK, et al., 2019) is: 2.2%   Values used to calculate the score:     Age: 53 years     Clincally relevant sex: Female     Is Non-Hispanic African American: No     Diabetic: No     Tobacco smoker: No     Systolic Blood Pressure: 128 mmHg     Is BP treated: Yes     HDL Cholesterol: 55 mg/dL     Total Cholesterol: 214 mg/dL

## 2024-07-22 NOTE — Progress Notes (Signed)
 BP 128/82 (BP Location: Left Arm, Patient Position: Sitting, Cuff Size: Large)   Pulse 82   Temp 98.6 F (37 C) (Oral)   Resp 15   Ht 5' 4.02 (1.626 m)   Wt 205 lb 9.6 oz (93.3 kg)   LMP 06/16/2024 (Approximate)   SpO2 97%   BMI 35.27 kg/m    Subjective:    Patient ID: Laurie Martinez, female    DOB: [birth year], 53 y.o.   MRN: [id number]  HPI: Laurie Martinez is a 53 y.o. female  Chief Complaint  Patient presents with   Depression/Anxiety    About normal. No recent highs or lows. Has been very steady.    Hypertension    Once a week normally checks ranging 130/80.    Migraine    Needs refill to alternative pharmacy.    HYPERTENSION/HLD Continues Lisinopril  5 MG daily. Has lost 5 lbs since last visit. At baseline has underlying migraines, last was one week ago.  Has not had a migraine in quite sometime before this, birth control helps these. Hypertension status: controlled  Satisfied with current treatment? yes Duration of hypertension: chronic BP monitoring frequency: weekly BP range: 120/80 range on average BP medication side effects:  no Medication compliance: good compliance Aspirin: no Recurrent headaches: on occasion with migraines Visual changes: no Palpitations: no Dyspnea: occasional with asthma Chest pain: no Lower extremity edema: no Dizzy/lightheaded: no  The 10-year ASCVD risk score (Arnett DK, et al., 2019) is: 2.2%   Values used to calculate the score:     Age: 82 years     Clincally relevant sex: Female     Is Non-Hispanic African American: No     Diabetic: No     Tobacco smoker: No     Systolic Blood Pressure: 128 mmHg     Is BP treated: Yes     HDL Cholesterol: 55 mg/dL     Total Cholesterol: 214 mg/dL    HYPOTHYROIDISM Continues to take Levothyroxine  50 MCG daily.  Is having more heart burn recently, Pepcid no longer working.  When it is really bad she will throw up with this. Has not tried any other OTC medications. Thyroid  control  status:controlled Satisfied with current treatment? yes Medication side effects: no Medication compliance: good compliance Recent dose adjustment:no Fatigue: a little bit Cold intolerance: at times, baseline Heat intolerance: no Weight gain: no Weight loss: no Constipation: yes - chronic issue takes Miralax daily Diarrhea/loose stools: no Palpitations: no Lower extremity edema: no Anxiety/depressed mood: yes, at baseline and stable   ASTHMA Uses Symbicort  PRN (does not use daily) and Proair  PRN. Singulair  daily for allergies. Asthma status: controlled Satisfied with current treatment?: yes Albuterol /rescue inhaler frequency: rarely Dyspnea frequency: occasional, rare Wheezing frequency: no Cough frequency: no Nocturnal symptom frequency: none Limitation of activity: none Current upper respiratory symptoms: no Triggers: seasonal allergies -- spring and fall & exercise Visits to ER or Urgent Care in past year: no Pneumovax: Up to Date Influenza: Not up to Date - refuses   DEPRESSION, ANXIETY, & INSOMNIA Taking Ambien  and Klonopin . Pt reports satisfaction with current treatment options and wishes to continue. Pt is aware of risks of benzo medication use to include increased sedation, respiratory suppression, falls, dependence and cardiovascular events. Pt would like to continue treatment as benefit determined to outweigh risk. Only uses Klonopin  and Ambien  as needed, not daily. Ambien  uses if does not need to go work, about 5 a month. Has not had to use Klonopin   at all recently.  L Theanine use at home, which helps anxiety.  Depressive symptoms are worse in the winter -- the past two winters have been worse. On review PDMP last Ambien  fill 3//6/25 and Klonopin  01/10/24. Mood status: controlled Satisfied with current treatment?: yes Symptom severity: moderate  Duration of current treatment : chronic Side effects: no Medication compliance: good compliance Psychotherapy/counseling:  yes in the past -- therapist retired -- not current Previous psychiatric medications:  several medications over the past years -- has gone to psychiatry in past -- Wellbutrin, Paxil, Lamictal, Effexor Depressed mood: no Anxious mood: no Anhedonia: occasional at baseline Significant weight loss or gain: no Insomnia: yes, takes Ambien  -- also L-Theanine Fatigue: yes Feelings of worthlessness or guilt: no Impaired concentration/indecisiveness: yes Suicidal ideations: no Hopelessness: no Crying spells: no     07/22/2024    8:09 AM 01/10/2024    8:24 AM 08/21/2023    8:25 AM 07/10/2023    8:40 AM 01/05/2023    8:16 AM  Depression screen PHQ 2/9  Decreased Interest 1 2 1 2 1   Down, Depressed, Hopeless 1 2 1 2 1   PHQ - 2 Score 2 4 2 4 2   Altered sleeping 1 2 3 3 3   Tired, decreased energy 3 2 3 3 3   Change in appetite 2 2 1 3 1   Feeling bad or failure about yourself  0 0 0 3 1  Trouble concentrating 2 3 2 2 3   Moving slowly or fidgety/restless 1 0 1 1 2   Suicidal thoughts 0 0 0 0 0  PHQ-9 Score 11 13 12 19 15   Difficult doing work/chores  Somewhat difficult Somewhat difficult Somewhat difficult        07/22/2024    8:09 AM 01/10/2024    8:24 AM 08/21/2023    8:25 AM 07/10/2023    8:40 AM  GAD 7 : Generalized Anxiety Score  Nervous, Anxious, on Edge 1 1 2 2   Control/stop worrying 0 0 1 2  Worry too much - different things 0 1 1 2   Trouble relaxing 1 0 1 1  Restless 1 1 1 1   Easily annoyed or irritable 1 2 1 1   Afraid - awful might happen 0 0 0 0  Total GAD 7 Score 4 5 7 9   Anxiety Difficulty  Somewhat difficult Somewhat difficult Somewhat difficult     Relevant past medical, surgical, family and social history reviewed and updated as indicated. Interim medical history since our last visit reviewed. Allergies and medications reviewed and updated.  Review of Systems  Constitutional:  Negative for activity change, appetite change, diaphoresis, fatigue and fever.  Respiratory:   Negative for cough, chest tightness and shortness of breath.   Cardiovascular:  Negative for chest pain, palpitations and leg swelling.  Gastrointestinal: Negative.   Endocrine: Negative for cold intolerance and heat intolerance.  Neurological: Negative.   Psychiatric/Behavioral:  Positive for sleep disturbance. Negative for decreased concentration, self-injury and suicidal ideas. The patient is nervous/anxious.    Per HPI unless specifically indicated above     Objective:    BP 128/82 (BP Location: Left Arm, Patient Position: Sitting, Cuff Size: Large)   Pulse 82   Temp 98.6 F (37 C) (Oral)   Resp 15   Ht 5' 4.02 (1.626 m)   Wt 205 lb 9.6 oz (93.3 kg)   LMP 06/16/2024 (Approximate)   SpO2 97%   BMI 35.27 kg/m   Wt Readings from Last 3 Encounters:  07/22/24 205  lb 9.6 oz (93.3 kg)  01/10/24 210 lb (95.3 kg)  08/21/23 206 lb 3.2 oz (93.5 kg)    Physical Exam Vitals and nursing note reviewed.  Constitutional:      General: She is awake. She is not in acute distress.    Appearance: She is well-developed and well-groomed. She is obese. She is not ill-appearing or toxic-appearing.  HENT:     Head: Normocephalic.     Right Ear: Hearing normal.     Left Ear: Hearing normal.  Eyes:     General: Lids are normal.        Right eye: No discharge.        Left eye: No discharge.     Conjunctiva/sclera: Conjunctivae normal.     Pupils: Pupils are equal, round, and reactive to light.  Neck:     Thyroid : No thyromegaly.     Vascular: No carotid bruit.  Cardiovascular:     Rate and Rhythm: Normal rate and regular rhythm.     Heart sounds: Normal heart sounds. No murmur heard.    No gallop.  Pulmonary:     Effort: Pulmonary effort is normal. No accessory muscle usage or respiratory distress.     Breath sounds: Normal breath sounds.  Abdominal:     General: Bowel sounds are normal.     Palpations: Abdomen is soft.  Musculoskeletal:     Cervical back: Normal range of motion and  neck supple.     Right lower leg: No edema.     Left lower leg: No edema.  Skin:    General: Skin is warm and dry.  Neurological:     Mental Status: She is alert and oriented to person, place, and time.  Psychiatric:        Attention and Perception: Attention normal.        Mood and Affect: Mood normal.        Speech: Speech normal.        Behavior: Behavior normal. Behavior is cooperative.        Thought Content: Thought content normal.    Results for orders placed or performed in visit on 01/10/24  Microalbumin, Urine Waived   Collection Time: 01/10/24  8:50 AM  Result Value Ref Range   Microalb, Ur Waived 80 (H) 0 - 19 mg/L   Creatinine, Urine Waived 200 10 - 300 mg/dL   Microalb/Creat Ratio 30-300 (H) <30 mg/g  [id number] 11+Oxyco+Alc+Crt-Bund   Collection Time: 01/10/24  8:50 AM  Result Value Ref Range   Ethanol Negative Cutoff=0.020 %   Amphetamines, Urine Negative Cutoff=1000 ng/mL   Barbiturate Negative Cutoff=200 ng/mL   BENZODIAZ UR QL Negative Cutoff=200 ng/mL   Cannabinoid Quant, Ur Negative Cutoff=50 ng/mL   Cocaine (Metabolite) Negative Cutoff=300 ng/mL   OPIATE SCREEN URINE Negative Cutoff=300 ng/mL   Oxycodone/Oxymorphone, Urine Negative Cutoff=300 ng/mL   Phencyclidine Negative Cutoff=25 ng/mL   Methadone Screen, Urine Negative Cutoff=300 ng/mL   Propoxyphene Negative Cutoff=300 ng/mL   Meperidine Negative Cutoff=200 ng/mL   Tramadol Negative Cutoff=200 ng/mL   Creatinine 126.3 20.0 - 300.0 mg/dL   pH, Urine 6.1 4.5 - 8.9  T4, free   Collection Time: 01/10/24  9:44 AM  Result Value Ref Range   Free T4 1.03 0.82 - 1.77 ng/dL  CBC with Differential/Platelet   Collection Time: 01/10/24  9:44 AM  Result Value Ref Range   WBC 8.8 3.4 - 10.8 x10E3/uL   RBC 4.25 3.77 - 5.28 x10E6/uL   Hemoglobin 14.0  11.1 - 15.9 g/dL   Hematocrit 59.1 65.9 - 46.6 %   MCV 96 79 - 97 fL   MCH 32.9 26.6 - 33.0 pg   MCHC 34.3 31.5 - 35.7 g/dL   RDW 88.2 88.2 - 84.5 %    Platelets 321 150 - 450 x10E3/uL   Neutrophils 61 Not Estab. %   Lymphs 30 Not Estab. %   Monocytes 6 Not Estab. %   Eos 2 Not Estab. %   Basos 1 Not Estab. %   Neutrophils Absolute 5.4 1.4 - 7.0 x10E3/uL   Lymphocytes Absolute 2.7 0.7 - 3.1 x10E3/uL   Monocytes Absolute 0.5 0.1 - 0.9 x10E3/uL   EOS (ABSOLUTE) 0.2 0.0 - 0.4 x10E3/uL   Basophils Absolute 0.1 0.0 - 0.2 x10E3/uL   Immature Granulocytes 0 Not Estab. %   Immature Grans (Abs) 0.0 0.0 - 0.1 x10E3/uL  Lipid Panel w/o Chol/HDL Ratio   Collection Time: 01/10/24  9:44 AM  Result Value Ref Range   Cholesterol, Total 214 (H) 100 - 199 mg/dL   Triglycerides 847 (H) 0 - 149 mg/dL   HDL 55 >60 mg/dL   VLDL Cholesterol Cal 27 5 - 40 mg/dL   LDL Chol Calc (NIH) 867 (H) 0 - 99 mg/dL  TSH   Collection Time: 01/10/24  9:44 AM  Result Value Ref Range   TSH 2.000 0.450 - 4.500 uIU/mL  VITAMIN D  25 Hydroxy (Vit-D Deficiency, Fractures)   Collection Time: 01/10/24  9:44 AM  Result Value Ref Range   Vit D, 25-Hydroxy 82.6 30.0 - 100.0 ng/mL  Comprehensive metabolic panel   Collection Time: 01/10/24  9:44 AM  Result Value Ref Range   Glucose 87 70 - 99 mg/dL   BUN 12 6 - 24 mg/dL   Creatinine, Ser 9.20 0.57 - 1.00 mg/dL   eGFR 90 >40 fO/fpw/8.26   BUN/Creatinine Ratio 15 9 - 23   Sodium 138 134 - 144 mmol/L   Potassium 4.7 3.5 - 5.2 mmol/L   Chloride 100 96 - 106 mmol/L   CO2 19 (L) 20 - 29 mmol/L   Calcium 8.9 8.7 - 10.2 mg/dL   Total Protein 6.7 6.0 - 8.5 g/dL   Albumin 3.9 3.8 - 4.9 g/dL   Globulin, Total 2.8 1.5 - 4.5 g/dL   Bilirubin Total <9.7 0.0 - 1.2 mg/dL   Alkaline Phosphatase 87 44 - 121 IU/L   AST 24 0 - 40 IU/L   ALT 14 0 - 32 IU/L      Assessment & Plan:   Problem List Items Addressed This Visit       Cardiovascular and Mediastinum   Migraines   Chronic, stable. Continue desogestrel -ethinyl estradiol  continuous for hormone maintenance. Continue use of sumatriptan  as prescribed PRN for migraine headaches.  Discussed the option of continuing continuous birth control up to age 25 for migraine prevention or allowing a one week break every month for menstrual cycle. Pt opted for continual birth control use at this time. Educated on the importance of annual mammograms with this option. Pt to notify provider with any changes in headaches, frequency, or severity.       Relevant Medications   SUMAtriptan  (IMITREX ) 20 MG/ACT nasal spray   clonazePAM  (KLONOPIN ) 0.5 MG tablet   sertraline  (ZOLOFT ) 25 MG tablet   Hypertension   Chronic, stable.  BP at goal in office today and at goal on home checks.  Continue current medication regimen and adjust as needed.  Recommend she monitor BP  at least a few mornings a week at home and document.  DASH diet at home.  Refills sent.  Labs today: at physical in March.         Respiratory   Asthma   Chronic, stable. To continue current medication regimen as prescribed, as has offered benefit for years. To notify provider with change in symptoms or increased use/need of rescue inhaler.         Digestive   Acid reflux   Ongoing issue, suspect related to hormone shifts and diet.  Pepcid not longer offering benefit.  Will trial Protonix  20 MG daily for shortest period possible, educated her on this medication and side effects.  Recommend she perform food journal and avoid trigger foods that cause reflux issues.      Relevant Medications   pantoprazole  (PROTONIX ) 20 MG tablet   ondansetron  (ZOFRAN -ODT) 4 MG disintegrating tablet     Endocrine   Hashimoto's thyroiditis   Chronic, stable.  Continue current Levothyroxine  dose and adjusted as needed.  Labs up to date.  Discussed Hashimoto's diet changes + magnesium/selenium daily as supplements.         Other   Obesity   BMI 35.27 with 5 lbs lost since last visit, praised for this.  Recommended eating smaller high protein, low fat meals more frequently and exercising 30 mins a day 5 times a week with a goal of 10-15lb  weight loss in the next 3 months. Patient voiced their understanding and motivation to adhere to these recommendations.       Long-term current use of benzodiazepine   Obtain UDS next 01/09/25.  Discussed at length risk of long term benzo use, at this time she uses appropriately, minimally.  Recommend return to minimal use.      Insomnia   Chronic, stable with intermittent Ambien  use.  Discussed sleep hygiene methods that may benefit her. Contract up to date for controlled substance and UDS next 01/09/25.  Refills sent.        Generalized anxiety disorder   Refer to depression plan of care.  Goal is minimal Klonopin  use.      Relevant Medications   sertraline  (ZOLOFT ) 25 MG tablet   Elevated LDL cholesterol level   Continue diet focus at this time and recheck lipid panel annually, at physical.  The 10-year ASCVD risk score (Arnett DK, et al., 2019) is: 2.2%   Values used to calculate the score:     Age: 42 years     Clincally relevant sex: Female     Is Non-Hispanic African American: No     Diabetic: No     Tobacco smoker: No     Systolic Blood Pressure: 128 mmHg     Is BP treated: Yes     HDL Cholesterol: 55 mg/dL     Total Cholesterol: 214 mg/dL       Depression - Primary   Chronic, stable.  Denies SI/HI. Continue PRN clonazepam  as needed for acute events -- she uses more often in fall and winter.  Continue Zoloft  25 MG daily as is offering benefit (she tried 50 MG and this was too much).  She can slowly wean off when season changes back to spring (March).  Educated her on this and BLACK BOX warning.  Recommend use of light therapy and showed her different things to use for this.  Obtain UDS 01/09/25, contract up to date.       Relevant Medications   sertraline  (ZOLOFT ) 25 MG tablet  Follow up plan: Return in about 6 months (around 01/19/2025) for Annual Physical -- after 01/10/35.

## 2024-07-22 NOTE — Assessment & Plan Note (Signed)
 Ongoing issue, suspect related to hormone shifts and diet.  Pepcid not longer offering benefit.  Will trial Protonix  20 MG daily for shortest period possible, educated her on this medication and side effects.  Recommend she perform food journal and avoid trigger foods that cause reflux issues.

## 2024-07-22 NOTE — Assessment & Plan Note (Signed)
 Refer to depression plan of care.  Goal is minimal Klonopin use.

## 2024-07-22 NOTE — Assessment & Plan Note (Signed)
 Chronic, stable with intermittent Ambien use.  Discussed sleep hygiene methods that may benefit her. Contract up to date for controlled substance and UDS next 01/09/25.  Refills sent.

## 2024-07-22 NOTE — Assessment & Plan Note (Signed)
 Chronic, stable.  Denies SI/HI. Continue PRN clonazepam  as needed for acute events -- she uses more often in fall and winter.  Continue Zoloft  25 MG daily as is offering benefit (she tried 50 MG and this was too much).  She can slowly wean off when season changes back to spring (March).  Educated her on this and BLACK BOX warning.  Recommend use of light therapy and showed her different things to use for this.  Obtain UDS 01/09/25, contract up to date.

## 2024-07-22 NOTE — Assessment & Plan Note (Signed)
 Chronic, stable. Continue desogestrel -ethinyl estradiol  continuous for hormone maintenance. Continue use of sumatriptan  as prescribed PRN for migraine headaches. Discussed the option of continuing continuous birth control up to age 53 for migraine prevention or allowing a one week break every month for menstrual cycle. Pt opted for continual birth control use at this time. Educated on the importance of annual mammograms with this option. Pt to notify provider with any changes in headaches, frequency, or severity.

## 2024-07-22 NOTE — Assessment & Plan Note (Signed)
 Chronic, stable.  Continue current Levothyroxine  dose and adjusted as needed.  Labs up to date.  Discussed Hashimoto's diet changes + magnesium/selenium daily as supplements.

## 2024-07-22 NOTE — Assessment & Plan Note (Signed)
 Obtain UDS next 01/09/25.  Discussed at length risk of long term benzo use, at this time she uses appropriately, minimally.  Recommend return to minimal use.

## 2024-07-22 NOTE — Assessment & Plan Note (Signed)
 Chronic, stable.  BP at goal in office today and at goal on home checks.  Continue current medication regimen and adjust as needed.  Recommend she monitor BP at least a few mornings a week at home and document.  DASH diet at home.  Refills sent.  Labs today: at physical in March.

## 2024-08-14 ENCOUNTER — Other Ambulatory Visit: Payer: Self-pay | Admitting: Nurse Practitioner

## 2024-08-14 DIAGNOSIS — Z1231 Encounter for screening mammogram for malignant neoplasm of breast: Secondary | ICD-10-CM

## 2024-09-12 ENCOUNTER — Encounter

## 2024-09-19 ENCOUNTER — Ambulatory Visit
Admission: RE | Admit: 2024-09-19 | Discharge: 2024-09-19 | Disposition: A | Discharge status: Home / Self Care | Source: Ambulatory Visit | Attending: Nurse Practitioner | Admitting: Nurse Practitioner

## 2024-09-19 DIAGNOSIS — Z1231 Encounter for screening mammogram for malignant neoplasm of breast: Secondary | ICD-10-CM | POA: Diagnosis present

## 2025-01-30 ENCOUNTER — Encounter: Admitting: Nurse Practitioner
